# Patient Record
Sex: Female | Born: 1985 | Race: White | Hispanic: No | Marital: Married | Smoking: Former smoker
Health system: Southern US, Community
[De-identification: ages and names within clinical notes are randomized; demographics above are authoritative.]

## PROBLEM LIST (undated history)

## (undated) DIAGNOSIS — F419 Anxiety disorder, unspecified: Secondary | ICD-10-CM

## (undated) DIAGNOSIS — I7300 Raynaud's syndrome without gangrene: Secondary | ICD-10-CM

## (undated) DIAGNOSIS — J45909 Unspecified asthma, uncomplicated: Secondary | ICD-10-CM

## (undated) DIAGNOSIS — IMO0002 Reserved for concepts with insufficient information to code with codable children: Secondary | ICD-10-CM

## (undated) DIAGNOSIS — R87619 Unspecified abnormal cytological findings in specimens from cervix uteri: Secondary | ICD-10-CM

## (undated) DIAGNOSIS — R51 Headache: Secondary | ICD-10-CM

## (undated) DIAGNOSIS — B999 Unspecified infectious disease: Secondary | ICD-10-CM

## (undated) HISTORY — PX: DILATION AND CURETTAGE OF UTERUS: SHX78

---
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
## 2010-06-04 ENCOUNTER — Emergency Department (HOSPITAL_COMMUNITY)
Admission: EM | Admit: 2010-06-04 | Discharge: 2010-06-05 | Disposition: A | Discharge status: Home / Self Care | Payer: BC Managed Care – PPO | Attending: Emergency Medicine | Admitting: Emergency Medicine

## 2010-06-04 DIAGNOSIS — L272 Dermatitis due to ingested food: Secondary | ICD-10-CM | POA: Insufficient documentation

## 2010-06-04 DIAGNOSIS — IMO0002 Reserved for concepts with insufficient information to code with codable children: Secondary | ICD-10-CM | POA: Insufficient documentation

## 2010-06-04 DIAGNOSIS — W64XXXA Exposure to other animate mechanical forces, initial encounter: Secondary | ICD-10-CM | POA: Insufficient documentation

## 2010-06-04 DIAGNOSIS — R21 Rash and other nonspecific skin eruption: Secondary | ICD-10-CM | POA: Insufficient documentation

## 2010-06-04 DIAGNOSIS — M25579 Pain in unspecified ankle and joints of unspecified foot: Secondary | ICD-10-CM | POA: Insufficient documentation

## 2011-04-25 ENCOUNTER — Ambulatory Visit
Admission: RE | Admit: 2011-04-25 | Discharge: 2011-04-25 | Disposition: A | Discharge status: Home / Self Care | Payer: BC Managed Care – PPO | Source: Ambulatory Visit | Attending: Physician Assistant | Admitting: Physician Assistant

## 2011-04-25 ENCOUNTER — Other Ambulatory Visit: Payer: Self-pay | Admitting: Physician Assistant

## 2011-04-25 ENCOUNTER — Encounter (HOSPITAL_COMMUNITY): Payer: Self-pay | Admitting: General Practice

## 2011-04-25 ENCOUNTER — Inpatient Hospital Stay (HOSPITAL_COMMUNITY)
Admission: AD | Admit: 2011-04-25 | Discharge: 2011-04-27 | DRG: 368 | Disposition: A | Discharge status: Home / Self Care | Payer: BC Managed Care – PPO | Source: Ambulatory Visit | Attending: Obstetrics and Gynecology | Admitting: Obstetrics and Gynecology

## 2011-04-25 DIAGNOSIS — N731 Chronic parametritis and pelvic cellulitis: Principal | ICD-10-CM | POA: Diagnosis present

## 2011-04-25 DIAGNOSIS — N949 Unspecified condition associated with female genital organs and menstrual cycle: Secondary | ICD-10-CM | POA: Diagnosis present

## 2011-04-25 HISTORY — DX: Reserved for concepts with insufficient information to code with codable children: IMO0002

## 2011-04-25 HISTORY — DX: Unspecified abnormal cytological findings in specimens from cervix uteri: R87.619

## 2011-04-25 HISTORY — DX: Headache: R51

## 2011-04-25 HISTORY — DX: Anxiety disorder, unspecified: F41.9

## 2011-04-25 HISTORY — DX: Unspecified infectious disease: B99.9

## 2011-04-25 MED ORDER — SODIUM CHLORIDE 0.9 % IV SOLN
2.0000 g | Freq: Four times a day (QID) | INTRAVENOUS | Status: DC
  Administered 2011-04-25 – 2011-04-27 (×6): 2 g via INTRAVENOUS
  Filled 2011-04-25 (×9): qty 2000

## 2011-04-25 MED ORDER — SODIUM CHLORIDE 0.9 % IJ SOLN
3.0000 mL | Freq: Two times a day (BID) | INTRAMUSCULAR | Status: DC
  Administered 2011-04-25: 3 mL via INTRAVENOUS

## 2011-04-25 MED ORDER — IOHEXOL 300 MG/ML  SOLN
100.0000 mL | Freq: Once | INTRAMUSCULAR | Status: AC | PRN
  Administered 2011-04-25: 100 mL via INTRAVENOUS

## 2011-04-25 MED ORDER — SODIUM CHLORIDE 0.9 % IJ SOLN
3.0000 mL | INTRAMUSCULAR | Status: DC | PRN
  Administered 2011-04-25 – 2011-04-26 (×2): 3 mL via INTRAVENOUS

## 2011-04-25 MED ORDER — GENTAMICIN SULFATE 40 MG/ML IJ SOLN
Freq: Once | INTRAVENOUS | Status: AC
  Administered 2011-04-25: 22:00:00 via INTRAVENOUS
  Filled 2011-04-25: qty 3

## 2011-04-25 MED ORDER — GENTAMICIN SULFATE 40 MG/ML IJ SOLN
Freq: Three times a day (TID) | INTRAVENOUS | Status: DC
  Administered 2011-04-26 – 2011-04-27 (×4): via INTRAVENOUS
  Filled 2011-04-25 (×6): qty 2.75

## 2011-04-25 MED ORDER — CLINDAMYCIN PHOSPHATE 900 MG/50ML IV SOLN
900.0000 mg | Freq: Three times a day (TID) | INTRAVENOUS | Status: DC
  Filled 2011-04-25: qty 50

## 2011-04-25 NOTE — Progress Notes (Signed)
ANTIBIOTIC CONSULT NOTE - INITIAL  Pharmacy Consult for gentamicin Indication: pelvic abcess  No Known Allergies  Patient Measurements: Height: 5\' 6"  (167.6 cm) Weight: 117 lb (53.071 kg) IBW/kg (Calculated) : 59.3    Vital Signs: Temp: 98.7 F (37.1 C) (03/25 2003) Temp src: Oral (03/25 2003) BP: 123/79 mmHg (03/25 2003) Pulse Rate: 100  (03/25 2003) Intake/Output from previous day:   Intake/Output from this shift:    Labs: No results found for this basename: WBC:3,HGB:3,PLT:3,LABCREA:3,CREATININE:3 in the last 72 hours CrCl is unknown because no creatinine reading has been taken. No results found for this basename: VANCOTROUGH:2,VANCOPEAK:2,VANCORANDOM:2,GENTTROUGH:2,GENTPEAK:2,GENTRANDOM:2,TOBRATROUGH:2,TOBRAPEAK:2,TOBRARND:2,AMIKACINPEAK:2,AMIKACINTROU:2,AMIKACIN:2, in the last 72 hours   Microbiology: No results found for this or any previous visit (from the past 720 hour(s)).  Medical History: Past Medical History  Diagnosis Date  . Anxiety   . Abnormal Pap smear   . Infection     gonorrhea   . Headache     Medications:  Scheduled:    . ampicillin (OMNIPEN) IV  2 g Intravenous Q6H  . gentamicin (GARAMYCIN) with clindamycin (CLEOCIN) IV   Intravenous Once   Followed by  . gentamicin (GARAMYCIN) with clindamycin (CLEOCIN) IV   Intravenous Q8H  . sodium chloride  3 mL Intravenous Q12H  . DISCONTD: clindamycin (CLEOCIN) IV  900 mg Intravenous Q8H   Assessment: 26 yo female with pelvic abscess. Pain on right and left side reporting pain of 8/10.  UTI treated 3 weeks ago.  Will start Ampicillin/ Gentamicin/Clindamycin.   Goal of Therapy:  Gentamicin peak 6-8, trough <1.    Plan:  Gentamicin 120mg  loading dose followed by 110mg  IV Q 8 hours. Will check SCR in the AM.   Mackenzie Anderson Mackenzie Anderson 04/25/2011,8:49 PM

## 2011-04-25 NOTE — Progress Notes (Signed)
Subjective: Patient reports tolerating PO and no problems voiding.   Pain improved. Vaginal drainage continues  Objective:  BP 118/74  Pulse 91  Temp(Src) 98.1 F (36.7 C) (Oral)  Resp 16  Ht 5\' 6"  (1.676 m)  Wt 53.071 kg (117 lb)  BMI 18.88 kg/m2  SpO2 100%  LMP 04/25/2011  I have reviewed patient's vital signs, medications, labs and radiology results.  General: alert, cooperative and appears stated age Resp: clear to auscultation bilaterally and normal percussion bilaterally Cardio: regular rate and rhythm, S1, S2 normal, no murmur, click, rub or gallop GI: soft, non-tender; bowel sounds normal; no masses,  no organomegaly Extremities: extremities normal, atraumatic, no cyanosis or edema Vaginal Bleeding: none and pustular dc noted   Assessment/Plan: Pelvic Abcess of ? Etx.  Spontaneous drainage/rupture into vagina. Will continue IV ABX and monitor clinical course.   LOS: 0 days    Athene Schuhmacher J 04/25/2011, 9:37 PM

## 2011-04-26 LAB — CREATININE, SERUM
Creatinine, Ser: 0.72 mg/dL (ref 0.50–1.10)
GFR calc Af Amer: >90 mL/min (ref >90)
GFR calc non Af Amer: >90 mL/min (ref >90)

## 2011-04-26 MED ORDER — IBUPROFEN 600 MG PO TABS: 600.0000 mg | ORAL_TABLET | Freq: Four times a day (QID) | ORAL | Status: DC | PRN

## 2011-04-26 MED ORDER — IBUPROFEN 600 MG PO TABS
600.0000 mg | ORAL_TABLET | Freq: Four times a day (QID) | ORAL | Status: DC | PRN
  Administered 2011-04-26 (×3): 600 mg via ORAL
  Filled 2011-04-26 (×3): qty 1

## 2011-04-26 MED ORDER — ESCITALOPRAM OXALATE 10 MG PO TABS
10.0000 mg | ORAL_TABLET | Freq: Every day | ORAL | Status: DC
  Administered 2011-04-26: 10 mg via ORAL
  Filled 2011-04-26 (×2): qty 1

## 2011-04-26 NOTE — Progress Notes (Signed)
Serum Creatinine drawn this am = 0.72; Gentamicin dose was calculated using an estimated SCr of 0.7.  Will continue gentamicin at same dose and monitor levels when necessary.

## 2011-04-26 NOTE — Progress Notes (Signed)
UR Chart review completed.  

## 2011-04-26 NOTE — Progress Notes (Signed)
Subjective: Patient reports tolerating PO, + flatus and no problems voiding.   Feels well. Minimal vaginal dc. No bleeding. Minimal pain.  Objective: BP 99/69  Pulse 85  Temp(Src) 98 F (36.7 C) (Oral)  Resp 18  Ht 5\' 6"  (1.676 m)  Wt 53.071 kg (117 lb)  BMI 18.88 kg/m2  SpO2 100%  LMP 04/25/2011  I have reviewed patient's vital signs, intake and output, labs and radiology results.  General: alert, cooperative, appears stated age, no distress and pale Resp: clear to auscultation bilaterally and normal percussion bilaterally Cardio: regular rate and rhythm, S1, S2 normal, no murmur, click, rub or gallop and normal apical impulse GI: soft, non-tender; bowel sounds normal; no masses,  no organomegaly, normal findings: bowel sounds normal, abnormal findings:  slight tendernsess to deep palpation and no rebound Extremities: extremities normal, atraumatic, no cyanosis or edema Vaginal Bleeding: none and small amount of pustular dc    Assessment/Plan: Pelvic Abcess spontaneously draining per vagina of ? etx Clinically improved on Triple ABx. Afebrile. Will continue IV ABX. Reck CBC in am.  LOS: 1 day    Mackenzie Anderson J 04/26/2011, 4:39 PM

## 2011-04-26 NOTE — H&P (Signed)
NAMEMAURICA, OMURA            ACCOUNT NO.:  [id number]  MEDICAL RECORD NO.:  [id number]  LOCATION:  9306                          FACILITY:  [facility]  PHYSICIAN:  Lenoard Aden, M.D.DATE OF BIRTH:  1985/08/12  DATE OF ADMISSION:  04/25/2011 DATE OF DISCHARGE:                             HISTORY & PHYSICAL   CHIEF COMPLAINT:  Pelvic pain.  HISTORY OF PRESENT ILLNESS:  This is a 26 year old white female, G0, P0, with remote history of PID, who presents now with 2-week history of pelvic pain, seen by her PCP, treated for constipation, now with CT exam today consistent with pelvic abscess.  She was noted to have spontaneous vaginal drainage, pustular discharge this afternoon, which was noted to be a spontaneous rupture of this abscess into the apex of the vagina.  PAST MEDICAL HISTORY:  Unremarkable.  Medications are birth control pills, Vyvanse, clonazepam, Lexapro, cephalexin p.r.n.  She has no known drug allergies.  FAMILY HISTORY:  Noncontributory.  PHYSICAL EXAMINATION:  VITAL SIGNS:  Stable.  Patient is afebrile.  HEENT:  Normal.  NECK:  Supple.  Full range of motion.  LUNGS:  Clear.  HEART:  Regular rhythm.  ABDOMEN:  Soft, scaphoid, mild tenderness to palpation.  No rebound, no guarding.  PELVIC:  Concerns of firm fluctuance in the cul-de-sac with drainage in the posterior fornix, a pustular material consistent with abscess, this is cultured.  IMPRESSION:  Pelvic abscess with questionable etiology with leukocytosis, questionable TOA  versus perirectal abscess.  CAT scan as noted.  Case was discussed with Radiology and General Surgery.  PLAN:  Will be to admit for IV antibiotic therapy.  Follow clinical course closely.  Follow up CT with possible drainage, pending clinical course.     Lenoard Aden, M.D.     RJT/MEDQ  D:  04/25/2011  T:  04/25/2011  Job:  [id number]

## 2011-04-27 LAB — CBC
HCT: 32.3 % — ABNORMAL LOW (ref 36.0–46.0)
MCHC: 31.9 g/dL (ref 30.0–36.0)
MCV: 85.4 fL (ref 78.0–100.0)
Platelets: 300 10*3/uL (ref 150–400)
RBC: 3.78 MIL/uL — ABNORMAL LOW (ref 3.87–5.11)
RDW: 12.3 % (ref 11.5–15.5)
WBC: 3.9 10*3/uL — ABNORMAL LOW (ref 4.0–10.5)

## 2011-04-27 MED ORDER — IBUPROFEN 600 MG PO TABS: 600.0000 mg | ORAL_TABLET | Freq: Four times a day (QID) | ORAL | Status: AC | PRN

## 2011-04-27 MED ORDER — LEVOFLOXACIN 500 MG PO TABS: 500.0000 mg | ORAL_TABLET | Freq: Every day | ORAL | Status: AC

## 2011-04-27 MED ORDER — METRONIDAZOLE 500 MG PO TABS: 500.0000 mg | ORAL_TABLET | Freq: Two times a day (BID) | ORAL | Status: AC

## 2011-04-27 NOTE — Progress Notes (Signed)
Subjective: Patient reports tolerating PO and no problems voiding.   Feels better. No vaginal dc. Mild discomfort with bowel movements.  Objective: I have reviewed patient's vital signs, intake and output, medications, labs and radiology results.  General: alert and no distress Resp: clear to auscultation bilaterally and normal percussion bilaterally Cardio: regular rate and rhythm, S1, S2 normal, no murmur, click, rub or gallop and normal apical impulse GI: soft, non-tender; bowel sounds normal; no masses,  no organomegaly and normal findings: aorta normal, bowel sounds normal, no bruits heard, soft, non-tender and umbilicus normal Extremities: extremities normal, atraumatic, no cyanosis or edema Vaginal Bleeding: none No pustular drainage   Assessment/Plan: Pelvic Abcess of ? etx- clinically improved DC home  Levofloxacin 500mg  qd x 12 Flagyl 500mg  bid x 12 Fu CT next week  LOS: 2 days    Mackenzie Anderson J 04/27/2011, 7:25 AM

## 2011-04-27 NOTE — Progress Notes (Signed)
PT.  WAS DISCHARGED IN THE CARE OF FRIEND. DOWNSTAIR PER AMBULATORY. DENIES ANY PAIN  OR DISCOMFORT. DENIES HEAVY VAGINAL BLEEDING.(MENES IN PROGRESS) STABLE.

## 2011-05-02 ENCOUNTER — Other Ambulatory Visit (HOSPITAL_COMMUNITY): Payer: Self-pay | Admitting: Obstetrics and Gynecology

## 2011-05-02 DIAGNOSIS — L0291 Cutaneous abscess, unspecified: Secondary | ICD-10-CM

## 2011-05-02 NOTE — Discharge Summary (Signed)
NAMEJUNI, GLAAB            ACCOUNT NO.:  [id number]  MEDICAL RECORD NO.:  [id number]  LOCATION:  9306                          FACILITY:  [facility]  PHYSICIAN:  Lenoard Aden, M.D.DATE OF BIRTH:  12-Jun-1985  DATE OF ADMISSION:  04/25/2011 DATE OF DISCHARGE:  04/27/2011                              DISCHARGE SUMMARY   ADMISSION DIAGNOSIS:  Pelvic abscess.  DISCHARGE DIAGNOSIS:  Pelvic abscess.  HOSPITAL COURSE:  The patient underwent admission for IV antibiotics due to spontaneous discovery of a draining pelvic abscess in the cul-de-sac. She was admitted previously with an office white blood cell count of 12. She was placed on __________, gentamicin, and clindamycin IV.  Her pain dramatically improved.  She remained febrile.  A repeat white blood cell count was 4.  She was discharged to home on hospital day #2.  DISCHARGE MEDICATIONS:  Levaquin and Flagyl.  She is to follow up in my office in 1 week.  Discharge teaching done . She has a repeat CT scan scheduled for 1 week.     Lenoard Aden, M.D.     RJT/MEDQ  D:  05/01/2011  T:  05/01/2011  Job:  [id number]

## 2011-05-03 ENCOUNTER — Encounter (HOSPITAL_COMMUNITY): Payer: Self-pay

## 2011-05-03 ENCOUNTER — Other Ambulatory Visit (HOSPITAL_COMMUNITY): Payer: Self-pay | Admitting: Obstetrics and Gynecology

## 2011-05-03 ENCOUNTER — Other Ambulatory Visit: Payer: Self-pay | Admitting: Radiology

## 2011-05-03 ENCOUNTER — Ambulatory Visit (HOSPITAL_COMMUNITY)
Admission: RE | Admit: 2011-05-03 | Discharge: 2011-05-03 | Disposition: A | Discharge status: Home / Self Care | Payer: BC Managed Care – PPO | Source: Ambulatory Visit | Attending: Obstetrics and Gynecology | Admitting: Obstetrics and Gynecology

## 2011-05-03 VITALS — BP 103/68 | HR 70 | Temp 97.0°F | Resp 16 | Ht 66.0 in | Wt 117.0 lb

## 2011-05-03 DIAGNOSIS — N731 Chronic parametritis and pelvic cellulitis: Secondary | ICD-10-CM | POA: Insufficient documentation

## 2011-05-03 DIAGNOSIS — L0291 Cutaneous abscess, unspecified: Secondary | ICD-10-CM

## 2011-05-03 DIAGNOSIS — N739 Female pelvic inflammatory disease, unspecified: Secondary | ICD-10-CM

## 2011-05-03 LAB — BASIC METABOLIC PANEL
CO2: 27 mEq/L (ref 19–32)
Calcium: 9.3 mg/dL (ref 8.4–10.5)
Chloride: 97 mEq/L (ref 96–112)
Glucose, Bld: 101 mg/dL — ABNORMAL HIGH (ref 70–99)
Sodium: 135 mEq/L (ref 135–145)

## 2011-05-03 LAB — CBC
HCT: 38.9 % (ref 36.0–46.0)
MCH: 27.9 pg (ref 26.0–34.0)
MCV: 82.8 fL (ref 78.0–100.0)
Platelets: 334 10*3/uL (ref 150–400)
RBC: 4.7 MIL/uL (ref 3.87–5.11)

## 2011-05-03 LAB — APTT: aPTT: 31 seconds (ref 24–37)

## 2011-05-03 MED ORDER — SODIUM CHLORIDE 0.9 % IV SOLN: Freq: Once | INTRAVENOUS | Status: DC

## 2011-05-03 MED ORDER — IOHEXOL 300 MG/ML  SOLN
80.0000 mL | Freq: Once | INTRAMUSCULAR | Status: AC | PRN
  Administered 2011-05-03: 80 mL via INTRAVENOUS

## 2011-05-03 NOTE — H&P (Signed)
Mackenzie Anderson is an 26 y.o. female.   Chief Complaint: pelvic inflammatory disease; pelvic abscess on 3/25 CT Spontaneous rupture of abscess through vagina Now scheduled for repeat CT to evaluate collection Also scheduled for pelvic abscess drain placement if necessary  HPI: pt feels so much better now; abd pain resolved  Past Medical History  Diagnosis Date  . Anxiety   . Abnormal Pap smear   . Infection     gonorrhea   . Headache     Past Surgical History  Procedure Date  . Wisdom tooth extraction 2008    No family history on file. Social History:  reports that she has never smoked. She does not have any smokeless tobacco history on file. She reports that she drinks alcohol. She reports that she does not use illicit drugs.  Allergies: No Known Allergies  Medications Prior to Admission  Medication Sig Dispense Refill  . escitalopram (LEXAPRO) 5 MG tablet Take 5 mg by mouth at bedtime.      Marland Kitchen ibuprofen (ADVIL,MOTRIN) 600 MG tablet Take 1 tablet (600 mg total) by mouth every 6 (six) hours as needed for pain.  30 tablet  0  . levofloxacin (LEVAQUIN) 500 MG tablet Take 1 tablet (500 mg total) by mouth daily.  12 tablet  0  . lisdexamfetamine (VYVANSE) 40 MG capsule Take 40 mg by mouth every morning.      . metroNIDAZOLE (FLAGYL) 500 MG tablet Take 1 tablet (500 mg total) by mouth 2 (two) times daily.  24 tablet  0  . Norgestimate-Ethinyl Estradiol Triphasic (ORTHO TRI-CYCLEN LO) 0.18/0.215/0.25 MG-25 MCG tablet Take 1 tablet by mouth daily.       Medications Prior to Admission  Medication Dose Route Frequency Provider Last Rate Last Dose  . 0.9 %  sodium chloride infusion   Intravenous Once Robet Leu, PA        No results found for this or any previous visit (from the past 48 hour(s)). No results found.  Review of Systems  Constitutional: Negative for fever.  Respiratory: Negative for cough.   Gastrointestinal: Negative for nausea, vomiting and abdominal pain.    Neurological: Negative for headaches.    Last menstrual period 04/25/2011. Physical Exam  Constitutional: She is oriented to person, place, and time. She appears well-developed and well-nourished.  Eyes: EOM are normal.  Cardiovascular: Normal rate, regular rhythm and normal heart sounds.   No murmur heard. Respiratory: Effort normal and breath sounds normal. She has no wheezes.  GI: Soft. Bowel sounds are normal. There is no tenderness.  Musculoskeletal: Normal range of motion.  Neurological: She is alert and oriented to person, place, and time.     Assessment/Plan Pelvic abscess that has spontaneously drained through vagina Scheduled for reCT to evaluate collection Abscess drain placement if necessary Pt aware of procedure benefits and risks and agreeable to proceed. Consent signed.  Algie Westry A 05/03/2011, 12:56 PM

## 2011-05-03 NOTE — Discharge Instructions (Signed)
Biopsy A biopsy is a procedure in which small samples of tissue are removed from the body. The tissue is examined under a microscope. A biopsy may be done to determine the cause (diagnosis) of a condition or mass (tumor). A biopsy may also be done to determine the best treatment for you. In some instances, a biopsy may be performed on normal tissue to determine if cancer has spread or if a transplanted organ is being rejected. There are 2 ways to obtain samples:  Fine needle biopsy. Samples are removed using a thin needle inserted through the skin.   Open biopsy. Samples are removed after a cut (incision) is made through the skin.  LET YOUR CAREGIVER KNOW ABOUT:  Allergies to food or medicine.   Medicines taken, including vitamins, herbs, eyedrops, over-the-counter medicines, and creams.   Use of steroids (by mouth or creams).   Previous problems with anesthetics or numbing medicines.   History of bleeding problems or blood clots.   Previous surgery.   Other health problems, including diabetes and kidney problems.   Possibility of pregnancy, if this applies.  RISKS AND COMPLICATIONS  Bleeding from the biopsy site. The risk of bleeding is higher if you have a bleeding disorder or are taking any blood thinning medicines (anticoagulants).   Infection.   Injury to organs or structures near the biopsy site.   Chronic pain at the biopsy site. This is defined as pain that lasts for more than 3 months.   Very rarely, a second biopsy may be required if not enough tissue was collected during the first biopsy.  BEFORE THE PROCEDURE Ask your caregiver what time you need to arrive for your procedure. Ask your caregiver whether you need to stop eating or drinking (fast) before your procedure. Ask your caregiver about changing or stopping your regular medicines. A blood sample may be done to determine your blood clotting time. Medicine may be given to help you relax (sedative). PROCEDURE During  a fine needle biopsy, you will be awake during the procedure. You will be positioned to allow the best possible access to the biopsy site. Let your caregiver know if the position is not comfortable. The biopsy site will be cleaned. A needle is inserted through your skin. You may feel mild discomfort during this procedure. The needle is withdrawn once tissue samples have been removed. Pressure may be applied to the biopsy site to reduce swelling and to ensure that bleeding has stopped. The samples will be sent to be examined. During an open biopsy, you may be given medicine that numbs the area (local anesthetic) or medicine that makes you sleep (general anesthetic). An incision is made through the skin. A tissue sample or the entire mass is removed. The sample or mass will be sent to be examined. Sometimes, the sample or mass may be examined during the procedure. If the sample or mass contains cancer cells, further tissue or structures may be removed. The incision is then closed with stitches (sutures) or skin glue (adhesive). AFTER THE PROCEDURE Your recovery will be assessed and monitored. If there are no problems, you should be able to go home shortly after the procedure (outpatient). You will need to arrange for someone to drive you home if you received a sedative or pain relieving medicine during the procedure. Ask when your test results will be ready. Make sure you get your test results. Document Released: 01/15/2000 Document Revised: 01/06/2011 Document Reviewed: 07/15/2010 [employer] Patient Information 2012 [employer]. 

## 2013-06-06 IMAGING — CT CT PELVIS W/ CM
3 series · 14 of 32 positions shown, 19 images · IV contrast (READICAT & 80ml omni 300)
Comparison: 04/25/2011

CLINICAL DATA: Pelvic abscess, spontaneous drainage vaginally.
Assess for residual abscess and possible drainage or aspiration.

CT PELVIS WITH CONTRAST
TECHNIQUE: Multidetector CT imaging of the pelvis was performed
using the standard protocol following the bolus administration of
intravenous contrast.
Contrast:   80 ml 2mnipaque-HSS

[Series 2: routine abdomen · axial · 0.74mm/px · z∈[-300,-135]mm · 4 of 55 slices shown, 9 images]
[im 11/55  soft-tissue]
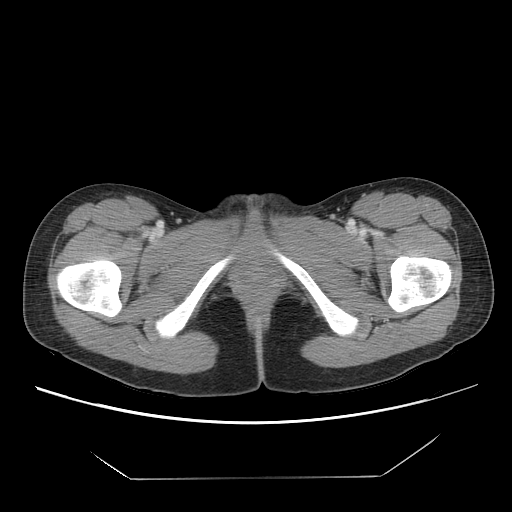
[im 11/55  lung]
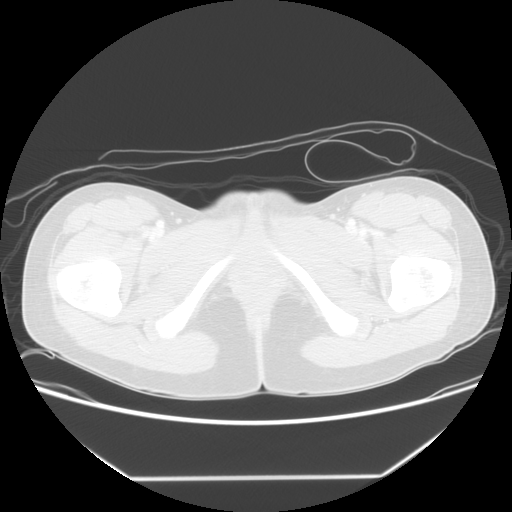
[im 11/55  bone]
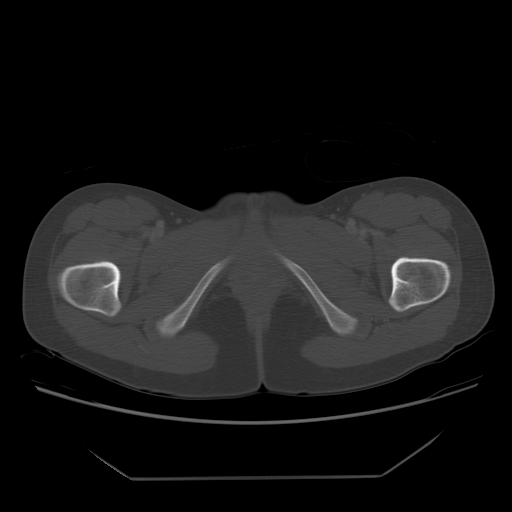
[im 22/55  soft-tissue]
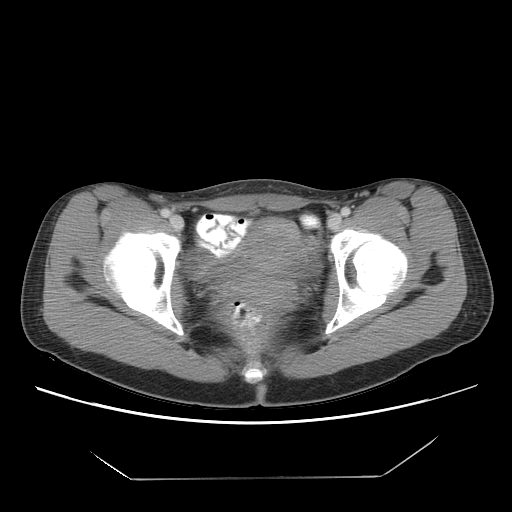
[im 22/55  lung]
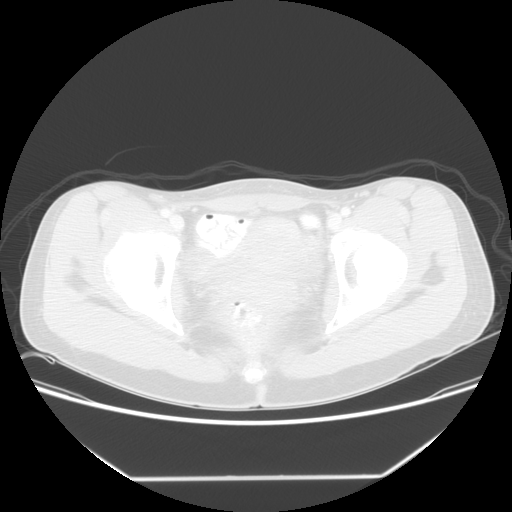
[im 33/55  soft-tissue]
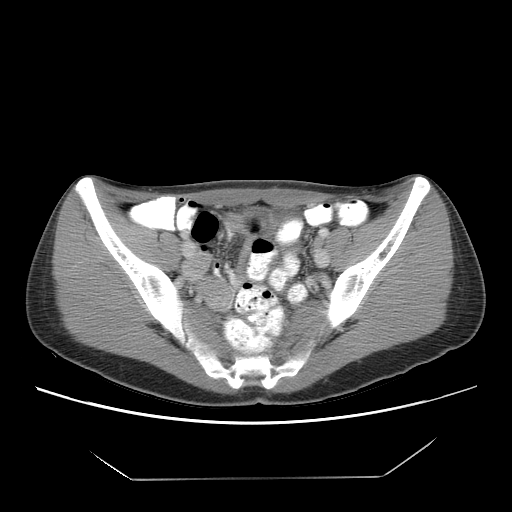
[im 33/55  lung]
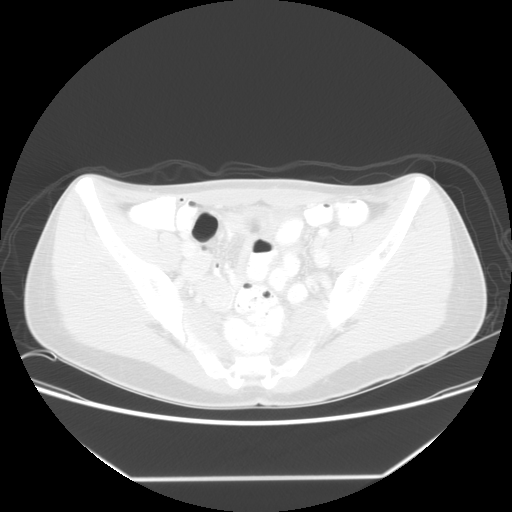
[im 44/55  soft-tissue]
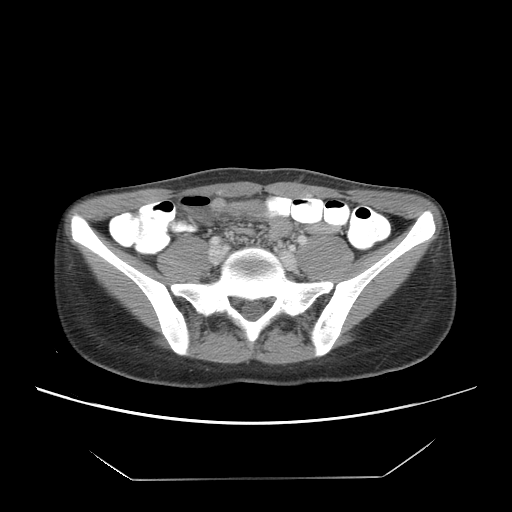
[im 44/55  lung]
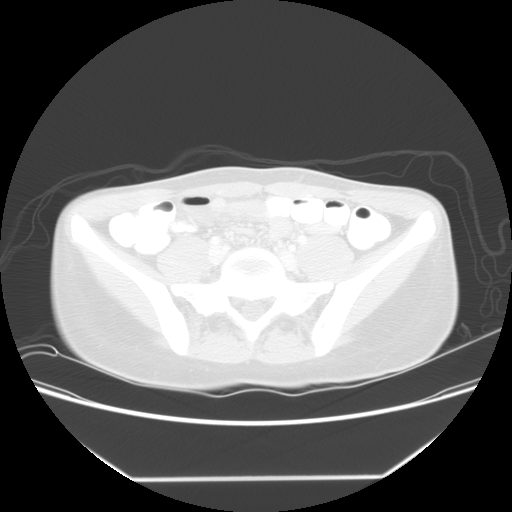

[Series 400: cor · coronal · 0.74mm/px · 8 of 114 slices shown]
[im 10/114  soft-tissue]
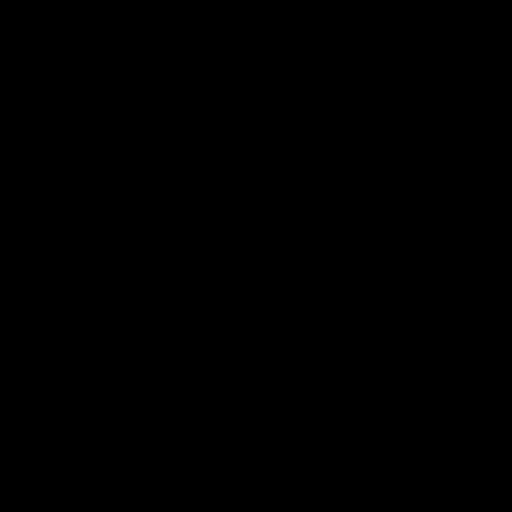
[im 29/114  soft-tissue]
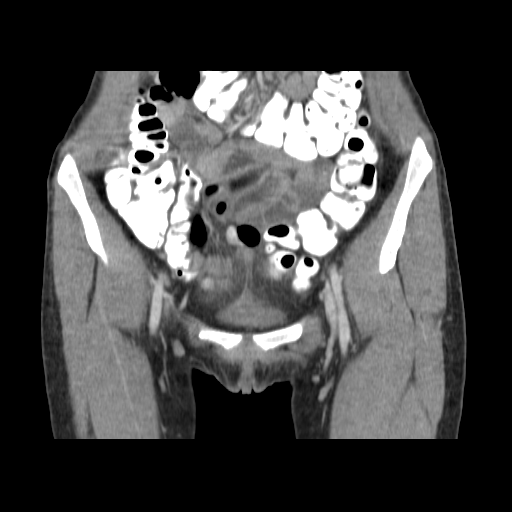
[im 38/114  soft-tissue]
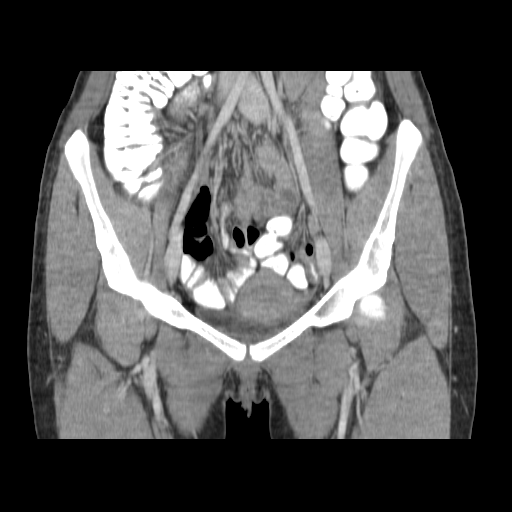
[im 48/114  soft-tissue]
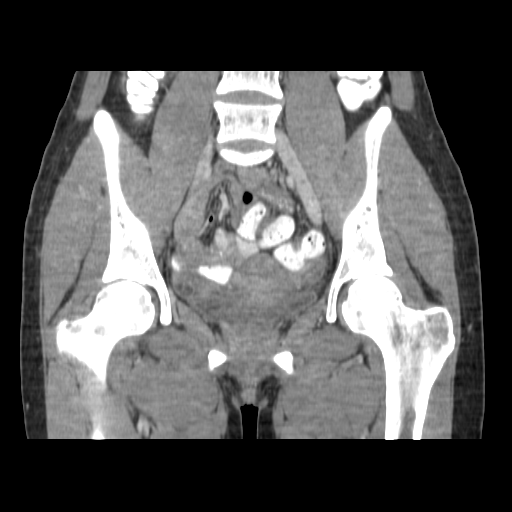
[im 66/114  soft-tissue]
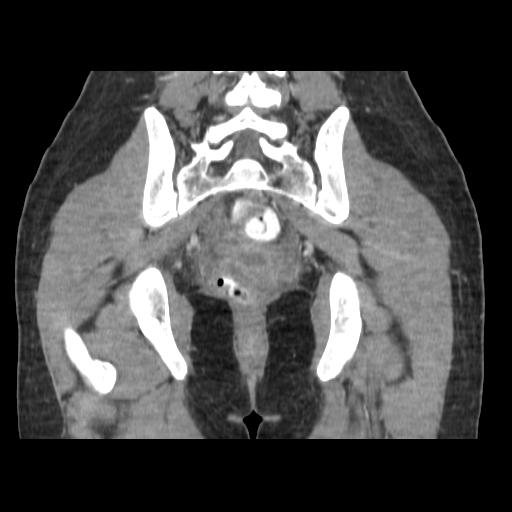
[im 76/114  soft-tissue]
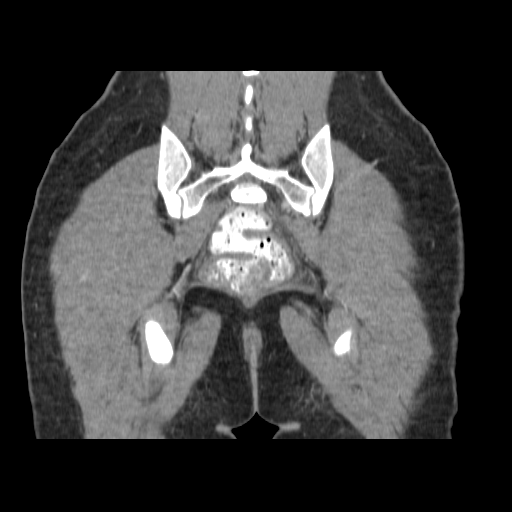
[im 85/114  soft-tissue]
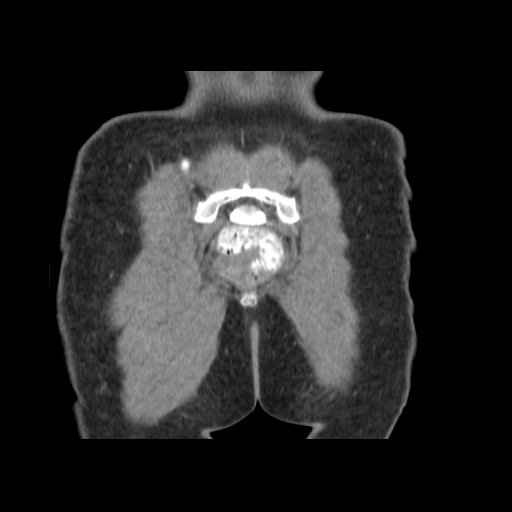
[im 104/114  soft-tissue]
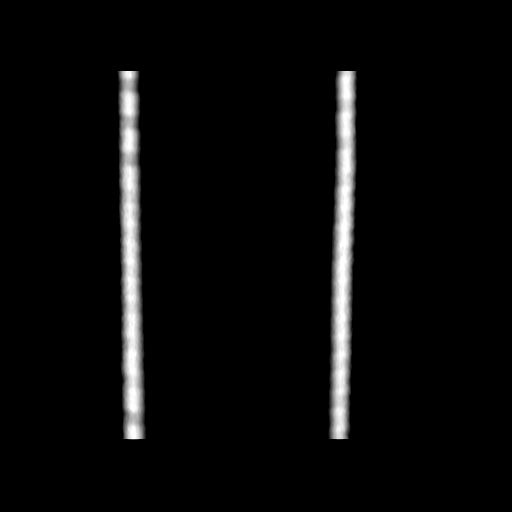

[Series 401: sag · sagittal · 0.74mm/px · 2 of 121 slices shown]
[im 11/121  soft-tissue]
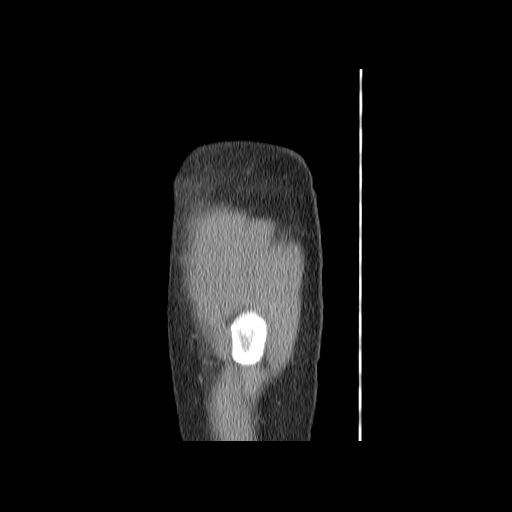
[im 31/121  soft-tissue]
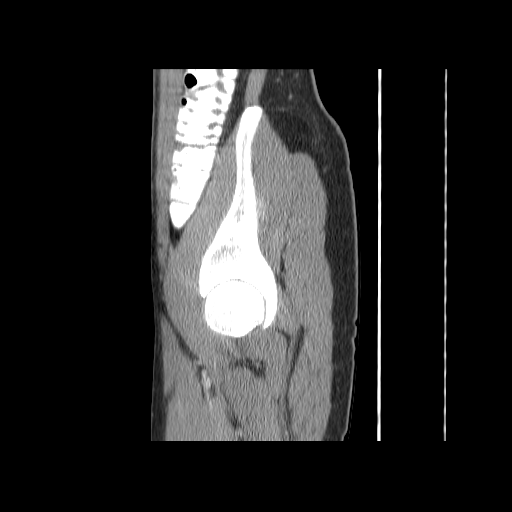

[14 of 32 positions shown; findings below may reference images not displayed]

FINDINGS: Compared to the prior study, the cul-de-sac enhancing
fluid collection has nearly resolved.  A small amount of fluid
remains with peripheral enhancement now measuring only 2.8 cm wide
and 11 mm in thickness.  No significant residual component that
warrants drain catheter placement or aspiration.

No other pelvic significant abnormality.  No acute distal bowel
process.  Negative for adenopathy.  Urinary bladder, uterus and
adnexa appear unremarkable by CT.  No inguinal abnormality.
IMPRESSION: Near complete resolution of the pelvic abscess.  No significant
residual component that warrants a drain catheter or aspiration.

## 2015-09-30 DIAGNOSIS — Z681 Body mass index (BMI) 19 or less, adult: Secondary | ICD-10-CM | POA: Diagnosis not present

## 2015-09-30 DIAGNOSIS — Z01419 Encounter for gynecological examination (general) (routine) without abnormal findings: Secondary | ICD-10-CM | POA: Diagnosis not present

## 2015-10-09 DIAGNOSIS — F900 Attention-deficit hyperactivity disorder, predominantly inattentive type: Secondary | ICD-10-CM | POA: Diagnosis not present

## 2015-10-09 DIAGNOSIS — F411 Generalized anxiety disorder: Secondary | ICD-10-CM | POA: Diagnosis not present

## 2015-10-09 DIAGNOSIS — R300 Dysuria: Secondary | ICD-10-CM | POA: Diagnosis not present

## 2015-10-14 DIAGNOSIS — R102 Pelvic and perineal pain: Secondary | ICD-10-CM | POA: Diagnosis not present

## 2016-01-01 DIAGNOSIS — L732 Hidradenitis suppurativa: Secondary | ICD-10-CM | POA: Diagnosis not present

## 2016-01-08 DIAGNOSIS — J329 Chronic sinusitis, unspecified: Secondary | ICD-10-CM | POA: Diagnosis not present

## 2016-01-22 DIAGNOSIS — N898 Other specified noninflammatory disorders of vagina: Secondary | ICD-10-CM | POA: Diagnosis not present

## 2016-01-22 DIAGNOSIS — J069 Acute upper respiratory infection, unspecified: Secondary | ICD-10-CM | POA: Diagnosis not present

## 2016-05-17 DIAGNOSIS — J029 Acute pharyngitis, unspecified: Secondary | ICD-10-CM | POA: Diagnosis not present

## 2016-05-17 DIAGNOSIS — F411 Generalized anxiety disorder: Secondary | ICD-10-CM | POA: Diagnosis not present

## 2016-05-17 DIAGNOSIS — J302 Other seasonal allergic rhinitis: Secondary | ICD-10-CM | POA: Diagnosis not present

## 2016-05-17 DIAGNOSIS — F900 Attention-deficit hyperactivity disorder, predominantly inattentive type: Secondary | ICD-10-CM | POA: Diagnosis not present

## 2016-10-17 DIAGNOSIS — J309 Allergic rhinitis, unspecified: Secondary | ICD-10-CM | POA: Diagnosis not present

## 2016-10-17 DIAGNOSIS — F900 Attention-deficit hyperactivity disorder, predominantly inattentive type: Secondary | ICD-10-CM | POA: Diagnosis not present

## 2016-10-17 DIAGNOSIS — F411 Generalized anxiety disorder: Secondary | ICD-10-CM | POA: Diagnosis not present

## 2017-03-10 DIAGNOSIS — Z681 Body mass index (BMI) 19 or less, adult: Secondary | ICD-10-CM | POA: Diagnosis not present

## 2017-03-10 DIAGNOSIS — Z01419 Encounter for gynecological examination (general) (routine) without abnormal findings: Secondary | ICD-10-CM | POA: Diagnosis not present

## 2017-03-17 DIAGNOSIS — L732 Hidradenitis suppurativa: Secondary | ICD-10-CM | POA: Diagnosis not present

## 2017-03-20 DIAGNOSIS — J209 Acute bronchitis, unspecified: Secondary | ICD-10-CM | POA: Diagnosis not present

## 2017-03-20 DIAGNOSIS — J029 Acute pharyngitis, unspecified: Secondary | ICD-10-CM | POA: Diagnosis not present

## 2017-03-29 DIAGNOSIS — F900 Attention-deficit hyperactivity disorder, predominantly inattentive type: Secondary | ICD-10-CM | POA: Diagnosis not present

## 2017-03-29 DIAGNOSIS — F411 Generalized anxiety disorder: Secondary | ICD-10-CM | POA: Diagnosis not present

## 2017-03-29 DIAGNOSIS — J309 Allergic rhinitis, unspecified: Secondary | ICD-10-CM | POA: Diagnosis not present

## 2017-03-29 DIAGNOSIS — J209 Acute bronchitis, unspecified: Secondary | ICD-10-CM | POA: Diagnosis not present

## 2017-05-01 DIAGNOSIS — J309 Allergic rhinitis, unspecified: Secondary | ICD-10-CM | POA: Diagnosis not present

## 2017-05-01 DIAGNOSIS — R05 Cough: Secondary | ICD-10-CM | POA: Diagnosis not present

## 2017-05-01 DIAGNOSIS — H1045 Other chronic allergic conjunctivitis: Secondary | ICD-10-CM | POA: Diagnosis not present

## 2017-07-04 DIAGNOSIS — Z8742 Personal history of other diseases of the female genital tract: Secondary | ICD-10-CM | POA: Diagnosis not present

## 2017-07-04 DIAGNOSIS — Z3201 Encounter for pregnancy test, result positive: Secondary | ICD-10-CM | POA: Diagnosis not present

## 2017-07-06 DIAGNOSIS — Z8742 Personal history of other diseases of the female genital tract: Secondary | ICD-10-CM | POA: Diagnosis not present

## 2017-07-10 DIAGNOSIS — Z8742 Personal history of other diseases of the female genital tract: Secondary | ICD-10-CM | POA: Diagnosis not present

## 2017-08-02 DIAGNOSIS — Z3201 Encounter for pregnancy test, result positive: Secondary | ICD-10-CM | POA: Diagnosis not present

## 2017-08-09 DIAGNOSIS — Z3401 Encounter for supervision of normal first pregnancy, first trimester: Secondary | ICD-10-CM | POA: Diagnosis not present

## 2017-08-09 DIAGNOSIS — Z3689 Encounter for other specified antenatal screening: Secondary | ICD-10-CM | POA: Diagnosis not present

## 2017-08-14 DIAGNOSIS — Z3689 Encounter for other specified antenatal screening: Secondary | ICD-10-CM | POA: Diagnosis not present

## 2017-08-14 DIAGNOSIS — O021 Missed abortion: Secondary | ICD-10-CM | POA: Diagnosis not present

## 2017-08-15 ENCOUNTER — Other Ambulatory Visit: Payer: Self-pay | Admitting: Obstetrics and Gynecology

## 2017-08-15 DIAGNOSIS — Z419 Encounter for procedure for purposes other than remedying health state, unspecified: Secondary | ICD-10-CM

## 2017-08-16 ENCOUNTER — Encounter (HOSPITAL_COMMUNITY): Payer: Self-pay | Admitting: Emergency Medicine

## 2017-08-16 ENCOUNTER — Other Ambulatory Visit (HOSPITAL_COMMUNITY): Payer: Self-pay | Admitting: Obstetrics and Gynecology

## 2017-08-17 ENCOUNTER — Ambulatory Visit (HOSPITAL_COMMUNITY)
Admission: RE | Admit: 2017-08-17 | Payer: BLUE CROSS/BLUE SHIELD | Source: Ambulatory Visit | Admitting: Obstetrics and Gynecology

## 2017-08-17 DIAGNOSIS — O021 Missed abortion: Secondary | ICD-10-CM | POA: Diagnosis not present

## 2017-08-17 SURGERY — DILATION AND EVACUATION, UTERUS
Anesthesia: Choice

## 2017-08-24 DIAGNOSIS — O021 Missed abortion: Secondary | ICD-10-CM | POA: Diagnosis not present

## 2017-09-05 DIAGNOSIS — O021 Missed abortion: Secondary | ICD-10-CM | POA: Diagnosis not present

## 2017-09-18 DIAGNOSIS — O021 Missed abortion: Secondary | ICD-10-CM | POA: Diagnosis not present

## 2017-09-26 DIAGNOSIS — J45909 Unspecified asthma, uncomplicated: Secondary | ICD-10-CM | POA: Diagnosis not present

## 2017-09-26 DIAGNOSIS — F900 Attention-deficit hyperactivity disorder, predominantly inattentive type: Secondary | ICD-10-CM | POA: Diagnosis not present

## 2017-09-26 DIAGNOSIS — F411 Generalized anxiety disorder: Secondary | ICD-10-CM | POA: Diagnosis not present

## 2017-09-28 DIAGNOSIS — O021 Missed abortion: Secondary | ICD-10-CM | POA: Diagnosis not present

## 2017-10-06 DIAGNOSIS — O021 Missed abortion: Secondary | ICD-10-CM | POA: Diagnosis not present

## 2017-10-13 DIAGNOSIS — O020 Blighted ovum and nonhydatidiform mole: Secondary | ICD-10-CM | POA: Diagnosis not present

## 2017-12-01 DIAGNOSIS — J029 Acute pharyngitis, unspecified: Secondary | ICD-10-CM | POA: Diagnosis not present

## 2018-01-08 DIAGNOSIS — H1045 Other chronic allergic conjunctivitis: Secondary | ICD-10-CM | POA: Diagnosis not present

## 2018-01-08 DIAGNOSIS — J3089 Other allergic rhinitis: Secondary | ICD-10-CM | POA: Diagnosis not present

## 2018-01-08 DIAGNOSIS — R05 Cough: Secondary | ICD-10-CM | POA: Diagnosis not present

## 2018-01-08 DIAGNOSIS — J301 Allergic rhinitis due to pollen: Secondary | ICD-10-CM | POA: Diagnosis not present

## 2018-02-21 DIAGNOSIS — R070 Pain in throat: Secondary | ICD-10-CM | POA: Diagnosis not present

## 2018-02-21 DIAGNOSIS — J343 Hypertrophy of nasal turbinates: Secondary | ICD-10-CM | POA: Diagnosis not present

## 2018-02-21 DIAGNOSIS — J342 Deviated nasal septum: Secondary | ICD-10-CM | POA: Diagnosis not present

## 2018-02-21 DIAGNOSIS — J310 Chronic rhinitis: Secondary | ICD-10-CM | POA: Diagnosis not present

## 2018-03-21 DIAGNOSIS — J310 Chronic rhinitis: Secondary | ICD-10-CM | POA: Diagnosis not present

## 2018-03-21 DIAGNOSIS — J343 Hypertrophy of nasal turbinates: Secondary | ICD-10-CM | POA: Diagnosis not present

## 2018-03-21 DIAGNOSIS — J342 Deviated nasal septum: Secondary | ICD-10-CM | POA: Diagnosis not present

## 2018-03-30 DIAGNOSIS — F900 Attention-deficit hyperactivity disorder, predominantly inattentive type: Secondary | ICD-10-CM | POA: Diagnosis not present

## 2018-03-30 DIAGNOSIS — J069 Acute upper respiratory infection, unspecified: Secondary | ICD-10-CM | POA: Diagnosis not present

## 2018-03-30 DIAGNOSIS — F411 Generalized anxiety disorder: Secondary | ICD-10-CM | POA: Diagnosis not present

## 2018-04-27 DIAGNOSIS — J343 Hypertrophy of nasal turbinates: Secondary | ICD-10-CM | POA: Diagnosis not present

## 2018-04-27 DIAGNOSIS — J310 Chronic rhinitis: Secondary | ICD-10-CM | POA: Diagnosis not present

## 2018-04-27 DIAGNOSIS — J342 Deviated nasal septum: Secondary | ICD-10-CM | POA: Diagnosis not present

## 2018-05-16 DIAGNOSIS — Z01419 Encounter for gynecological examination (general) (routine) without abnormal findings: Secondary | ICD-10-CM | POA: Diagnosis not present

## 2018-05-16 DIAGNOSIS — Z681 Body mass index (BMI) 19 or less, adult: Secondary | ICD-10-CM | POA: Diagnosis not present

## 2018-05-16 DIAGNOSIS — Z113 Encounter for screening for infections with a predominantly sexual mode of transmission: Secondary | ICD-10-CM | POA: Diagnosis not present

## 2018-05-16 DIAGNOSIS — Z1151 Encounter for screening for human papillomavirus (HPV): Secondary | ICD-10-CM | POA: Diagnosis not present

## 2018-05-16 DIAGNOSIS — Z124 Encounter for screening for malignant neoplasm of cervix: Secondary | ICD-10-CM | POA: Diagnosis not present

## 2018-07-27 ENCOUNTER — Other Ambulatory Visit: Payer: Self-pay | Admitting: Otolaryngology

## 2018-08-13 ENCOUNTER — Other Ambulatory Visit: Payer: Self-pay

## 2018-08-13 ENCOUNTER — Encounter (HOSPITAL_BASED_OUTPATIENT_CLINIC_OR_DEPARTMENT_OTHER): Payer: Self-pay | Admitting: *Deleted

## 2018-08-14 ENCOUNTER — Other Ambulatory Visit (HOSPITAL_COMMUNITY): Admission: RE | Admit: 2018-08-14 | Payer: BLUE CROSS/BLUE SHIELD | Source: Ambulatory Visit

## 2018-08-17 ENCOUNTER — Ambulatory Visit (HOSPITAL_BASED_OUTPATIENT_CLINIC_OR_DEPARTMENT_OTHER): Admit: 2018-08-17 | Payer: BLUE CROSS/BLUE SHIELD | Admitting: Otolaryngology

## 2018-08-17 HISTORY — DX: Raynaud's syndrome without gangrene: I73.00

## 2018-08-17 HISTORY — DX: Unspecified asthma, uncomplicated: J45.909

## 2018-08-17 SURGERY — SEPTOPLASTY, NOSE, WITH NASAL TURBINATE REDUCTION
Anesthesia: General | Laterality: Bilateral

## 2018-09-28 DIAGNOSIS — Z1159 Encounter for screening for other viral diseases: Secondary | ICD-10-CM | POA: Diagnosis not present

## 2018-10-04 DIAGNOSIS — J3489 Other specified disorders of nose and nasal sinuses: Secondary | ICD-10-CM | POA: Diagnosis not present

## 2018-10-04 DIAGNOSIS — J343 Hypertrophy of nasal turbinates: Secondary | ICD-10-CM | POA: Diagnosis not present

## 2018-10-04 DIAGNOSIS — J342 Deviated nasal septum: Secondary | ICD-10-CM | POA: Diagnosis not present

## 2018-10-10 DIAGNOSIS — F411 Generalized anxiety disorder: Secondary | ICD-10-CM | POA: Diagnosis not present

## 2018-10-10 DIAGNOSIS — F900 Attention-deficit hyperactivity disorder, predominantly inattentive type: Secondary | ICD-10-CM | POA: Diagnosis not present

## 2018-12-31 DIAGNOSIS — Z20828 Contact with and (suspected) exposure to other viral communicable diseases: Secondary | ICD-10-CM | POA: Diagnosis not present

## 2019-01-14 DIAGNOSIS — H1045 Other chronic allergic conjunctivitis: Secondary | ICD-10-CM | POA: Diagnosis not present

## 2019-01-14 DIAGNOSIS — J3089 Other allergic rhinitis: Secondary | ICD-10-CM | POA: Diagnosis not present

## 2019-01-14 DIAGNOSIS — R05 Cough: Secondary | ICD-10-CM | POA: Diagnosis not present

## 2019-01-14 DIAGNOSIS — J301 Allergic rhinitis due to pollen: Secondary | ICD-10-CM | POA: Diagnosis not present

## 2019-01-23 ENCOUNTER — Ambulatory Visit: Payer: BC Managed Care – PPO | Attending: Internal Medicine

## 2019-01-23 DIAGNOSIS — Z20828 Contact with and (suspected) exposure to other viral communicable diseases: Secondary | ICD-10-CM | POA: Diagnosis not present

## 2019-01-23 DIAGNOSIS — Z20822 Contact with and (suspected) exposure to covid-19: Secondary | ICD-10-CM

## 2019-01-24 LAB — NOVEL CORONAVIRUS, NAA: SARS-CoV-2, NAA: DETECTED — AB

## 2019-02-28 DIAGNOSIS — J310 Chronic rhinitis: Secondary | ICD-10-CM | POA: Diagnosis not present

## 2019-02-28 DIAGNOSIS — R430 Anosmia: Secondary | ICD-10-CM | POA: Diagnosis not present

## 2019-03-05 DIAGNOSIS — R519 Headache, unspecified: Secondary | ICD-10-CM | POA: Diagnosis not present

## 2019-04-11 ENCOUNTER — Other Ambulatory Visit: Payer: Self-pay

## 2019-04-11 ENCOUNTER — Encounter: Payer: Self-pay | Admitting: Neurology

## 2019-04-11 ENCOUNTER — Ambulatory Visit (INDEPENDENT_AMBULATORY_CARE_PROVIDER_SITE_OTHER): Payer: BC Managed Care – PPO | Admitting: Neurology

## 2019-04-11 VITALS — BP 107/73 | HR 86 | Resp 18 | Ht 66.0 in | Wt 122.0 lb

## 2019-04-11 DIAGNOSIS — G4440 Drug-induced headache, not elsewhere classified, not intractable: Secondary | ICD-10-CM

## 2019-04-11 DIAGNOSIS — G4452 New daily persistent headache (NDPH): Secondary | ICD-10-CM | POA: Diagnosis not present

## 2019-04-11 MED ORDER — RIZATRIPTAN BENZOATE 10 MG PO TABS: ORAL_TABLET | ORAL | 5 refills | Status: AC

## 2019-04-11 MED ORDER — ZONISAMIDE 25 MG PO CAPS: ORAL_CAPSULE | ORAL | 0 refills | Status: DC

## 2019-04-11 NOTE — Progress Notes (Signed)
NEUROLOGY CONSULTATION NOTE  Mackenzie Anderson MRN: [id number] DOB: September 30, 1985  Referring provider: Deatra James, MD Primary care provider: Deatra James, MD  Reason for consult:  Post-COVID headache  HISTORY OF PRESENT ILLNESS: Mackenzie Anderson. Mackenzie Anderson is a 34 year old female with asthma, ADD, depression, anxiety and Raynaud's who presents for worsening headache following Covid-19 infection.  History supplemented by referring provider note.  She was diagnosed with COVID-19 in late December after presenting with headache, loss of taste and smell and mild shortness of breath.  Symptoms lasted a couple of days.  She noted some nasal congestion and followed up with Dr. Suszanne Conners, ENT, who performed a septoplasty in September 2020 and was found to have no sinus infection.  However, she was prescribed a prednisone taper on 03/01/2019, which was ineffective.  Headaches were initially severe but now more mild pressure in the front and back of head, which has been pretty much persistent.  Upright makes it worse, laying down makes it better.  Maybe some associated photosensitivity and occasional nausea but no visual disturbance, phonophobia.  She has been treating daily with Advil and caffeine 120mg .    She denies history of headaches.    Caffeine:  She will drink an energy drink with 120mg  of caffeine or cup of coffee Diet:  32 oz water daily.  Does not skip meals. Exercise:  Routine Depression:  no; Anxiety:  A little Other pain:  Some mild posterior neck pain Sleep:  Varies.  Somewhat improved after sinus surgery and use of melatonin.  Wakes up a couple of times a night but able to fall back asleep. Family history of headache:  Mother (migraines)   PAST MEDICAL HISTORY: Past Medical History:  Diagnosis Date  . Abnormal Pap smear   . Anxiety   . Asthma   . Headache(784.0)   . Infection    gonorrhea   . Raynaud's disease     PAST SURGICAL HISTORY: Past Surgical History:  Procedure Laterality  Date  . DILATION AND CURETTAGE OF UTERUS    . WISDOM TOOTH EXTRACTION  2008    MEDICATIONS: Current Outpatient Medications on File Prior to Visit  Medication Sig Dispense Refill  . acetaminophen (TYLENOL) 325 MG tablet Take 650 mg by mouth every 6 (six) hours as needed.    . ALBUTEROL IN Inhale into the lungs.    amphetamine-dextroamphetamine (ADDERALL) 15 MG tablet Take 15 mg by mouth daily.    2009 BIOTIN PO Take by mouth.    . clonazePAM (KLONOPIN) 0.5 MG tablet Take 0.5 mg by mouth at bedtime.     No current facility-administered medications on file prior to visit.    ALLERGIES: Allergies  Allergen Reactions  . Hydrocodone Nausea Only and Other (See Comments)    Lethargy  . Other Other (See Comments)    Mango - Blisters around mouth    FAMILY HISTORY: History reviewed. No pertinent family history.  SOCIAL HISTORY: Social History   Socioeconomic History  . Marital status: Single    Spouse name: Not on file  . Number of children: Not on file  . Years of education: Not on file  . Highest education level: Not on file  Occupational History  . Not on file  Tobacco Use  . Smoking status: Former Marland Kitchen  . Smokeless tobacco: Never Used  . Tobacco comment: last Smoked early 20's  Substance and Sexual Activity  . Alcohol use: Yes    Comment: socially   . Drug use: No  .  Sexual activity: Yes    Birth control/protection: None  Other Topics Concern  . Not on file  Social History Narrative  . Not on file   Social Determinants of Health   Financial Resource Strain:   . Difficulty of Paying Living Expenses:   Food Insecurity:   . Worried About Charity fundraiser in the Last Year:   . Arboriculturist in the Last Year:   Transportation Needs:   . Film/video editor (Medical):   Marland Kitchen Lack of Transportation (Non-Medical):   Physical Activity:   . Days of Exercise per Week:   . Minutes of Exercise per Session:   Stress:   . Feeling of Stress :   Social  Connections:   . Frequency of Communication with Friends and Family:   . Frequency of Social Gatherings with Friends and Family:   . Attends Religious Services:   . Active Member of Clubs or Organizations:   . Attends Archivist Meetings:   Marland Kitchen Marital Status:   Intimate Partner Violence:   . Fear of Current or Ex-Partner:   . Emotionally Abused:   Marland Kitchen Physically Abused:   . Sexually Abused:      PHYSICAL EXAM: Blood pressure 107/73, pulse 86, resp. rate 18, height 5\' 6"  (1.676 m), weight 122 lb (55.3 kg), SpO2 99 %. General: No acute distress.  Patient appears well-groomed.   Head:  Normocephalic/atraumatic Eyes:  fundi examined but not visualized Neck: supple, no paraspinal tenderness, full range of motion Back: No paraspinal tenderness Heart: regular rate and rhythm Lungs: Clear to auscultation bilaterally. Vascular: No carotid bruits. Neurological Exam: Mental status: alert and oriented to person, place, and time, recent and remote memory intact, fund of knowledge intact, attention and concentration intact, speech fluent and not dysarthric, language intact. Cranial nerves: CN I: not tested CN II: pupils equal, round and reactive to light, visual fields intact CN III, IV, VI:  full range of motion, no nystagmus, no ptosis CN V: facial sensation intact CN VII: upper and lower face symmetric CN VIII: hearing intact CN IX, X: gag intact, uvula midline CN XI: sternocleidomastoid and trapezius muscles intact CN XII: tongue midline Bulk & Tone: normal, no fasciculations. Motor:  5/5 throughout  Sensation:  Pinprick and vibration sensation intact.  Deep Tendon Reflexes:  2+ throughout, toes downgoing.   Finger to nose testing:  Without dysmetria.   Heel to shin:  Without dysmetria.   Gait:  Normal station and stride.  Able to turn.  Romberg negative  IMPRESSION: Post-Covid persistent daily headache, complicated by medication overuse  PLAN: 1. Stop Advil and  caffeine. 2.  When headache gets severe, take rizatriptan 10mg .  May repeat after 2 hours if needed.  Maximum 2 tablets in 24 hours. 3.  Limit use of pain relievers to no more than 2 days out of week to prevent risk of rebound or medication-overuse headache. 4.  Start zonisamide 25mg  at bedtime for one week, then 50mg  at bedtime for one week, then 75mg  at bedtime for one week, then 100mg  at bedtime 5.  Follow up in 4 months.  Thank you for allowing me to take part in the care of this patient.  Metta Clines, DO  CC: Donald Prose, MD

## 2019-04-11 NOTE — Patient Instructions (Signed)
1. Stop Advil and caffeine. 2.  When headache gets severe, take rizatriptan 10mg .  May repeat after 2 hours if needed.  Maximum 2 tablets in 24 hours. 3.  Limit use of pain relievers to no more than 2 days out of week to prevent risk of rebound or medication-overuse headache. 4.  Start zonisamide 25mg  at bedtime for one week, then 50mg  at bedtime for one week, then 75mg  at bedtime for one week, then 100mg  at bedtime 5.  Follow up in 4 months.

## 2019-05-01 DIAGNOSIS — L732 Hidradenitis suppurativa: Secondary | ICD-10-CM | POA: Diagnosis not present

## 2019-05-10 ENCOUNTER — Other Ambulatory Visit: Payer: Self-pay | Admitting: Neurology

## 2019-06-12 DIAGNOSIS — Z01419 Encounter for gynecological examination (general) (routine) without abnormal findings: Secondary | ICD-10-CM | POA: Diagnosis not present

## 2019-06-12 DIAGNOSIS — Z681 Body mass index (BMI) 19 or less, adult: Secondary | ICD-10-CM | POA: Diagnosis not present

## 2019-07-19 DIAGNOSIS — F900 Attention-deficit hyperactivity disorder, predominantly inattentive type: Secondary | ICD-10-CM | POA: Diagnosis not present

## 2019-08-15 NOTE — Progress Notes (Deleted)
NEUROLOGY FOLLOW UP OFFICE NOTE  Mackenzie Anderson [id number]  HISTORY OF PRESENT ILLNESS: Mackenzie Mottley. Anderson is a 34 year old female with asthma, ADD, depression, anxiety and Raynaud's who follows up for headache.  UPDATE: Intensity:  *** Duration:  *** Frequency:  *** Frequency of abortive medication: *** Current NSAIDS:  *** Current analgesics:  *** Current triptans:  Rizatriptan 10mg  Current ergotamine:  *** Current anti-emetic:  *** Current muscle relaxants:  *** Current anti-anxiolytic:  *** Current sleep aide:  *** Current Antihypertensive medications:  *** Current Antidepressant medications:  *** Current Anticonvulsant medications:  *** Current anti-CGRP:  *** Current Vitamins/Herbal/Supplements:  *** Current Antihistamines/Decongestants:  *** Other therapy:  *** Other medication:  ***  Caffeine:  She will drink an energy drink with 120mg  of caffeine or cup of coffee Diet:  32 oz water daily.  Does not skip meals. Exercise:  Routine Depression:  no; Anxiety:  A little Other pain:  Some mild posterior neck pain Sleep:  Varies.  Somewhat improved after sinus surgery and use of melatonin.  Wakes up a couple of times a night but able to fall back asleep.  HISTORY: She was diagnosed with COVID-19 in late December after presenting with headache, loss of taste and smell and mild shortness of breath.  Symptoms lasted a couple of days.  She noted some nasal congestion and followed up with Dr. , ENT, who performed a septoplasty in September 2020 and was found to have no sinus infection.  However, she was prescribed a prednisone taper on 03/01/2019, which was ineffective.  Headaches were initially severe but now more mild pressure in the front and back of head, which has been pretty much persistent.  Upright makes it worse, laying down makes it better.  Maybe some associated photosensitivity and occasional nausea but no visual disturbance, phonophobia.  She has been  treating daily with Advil and caffeine 120mg .    Past rescue:  Advil, caffeine  She denies history of headaches.  Family history of headache:  Mother (migraines)  PAST MEDICAL HISTORY: Past Medical History:  Diagnosis Date  . Abnormal Pap smear   . Anxiety   . Asthma   . Headache(784.0)   . Infection    gonorrhea   . Raynaud's disease     MEDICATIONS: Current Outpatient Medications on File Prior to Visit  Medication Sig Dispense Refill  . acetaminophen (TYLENOL) 325 MG tablet Take 650 mg by mouth every 6 (six) hours as needed.    . ALBUTEROL IN Inhale into the lungs.    October 2020 amphetamine-dextroamphetamine (ADDERALL) 15 MG tablet Take 15 mg by mouth daily.    03/03/2019 BIOTIN PO Take by mouth.    . clonazePAM (KLONOPIN) 0.5 MG tablet Take 0.5 mg by mouth at bedtime.    . rizatriptan (MAXALT) 10 MG tablet Take 1 tablet at earliest onset for headache.  May repeat in 2 hours if needed. Maximum 2 tablets in 24 hours. 10 tablet 5  . zonisamide (ZONEGRAN) 25 MG capsule TAKE 1 CAPSULE AT BEDTIME FOR ONE WEEK, THEN 2 CAPSULES AT BEDTIME FOR ONE WEEK, THEN 3 CAPSULES AT BEDTIME FOR ONE WEEK, THEN 4 CAPSULES AT BEDTIME. 120 capsule 0   No current facility-administered medications on file prior to visit.    ALLERGIES: Allergies  Allergen Reactions  . Hydrocodone Nausea Only and Other (See Comments)    Lethargy  . Other Other (See Comments)    Mango - Blisters around mouth    FAMILY HISTORY: No  family history on file. ***.  SOCIAL HISTORY: Social History   Socioeconomic History  . Marital status: Single    Spouse name: Not on file  . Number of children: 0  . Years of education: 68  . Highest education level: Not on file  Occupational History  . Not on file  Tobacco Use  . Smoking status: Former Games developer  . Smokeless tobacco: Never Used  . Tobacco comment: last Smoked early 20's  Vaping Use  . Vaping Use: Former  Substance and Sexual Activity  . Alcohol use: Yes    Comment:  socially   . Drug use: No  . Sexual activity: Yes    Birth control/protection: None  Other Topics Concern  . Not on file  Social History Narrative   Right handed   One story home   Drinks caffeine    Social Determinants of Health   Financial Resource Strain:   . Difficulty of Paying Living Expenses:   Food Insecurity:   . Worried About Programme researcher, broadcasting/film/video in the Last Year:   . Barista in the Last Year:   Transportation Needs:   . Freight forwarder (Medical):   Marland Kitchen Lack of Transportation (Non-Medical):   Physical Activity:   . Days of Exercise per Week:   . Minutes of Exercise per Session:   Stress:   . Feeling of Stress :   Social Connections:   . Frequency of Communication with Friends and Family:   . Frequency of Social Gatherings with Friends and Family:   . Attends Religious Services:   . Active Member of Clubs or Organizations:   . Attends Banker Meetings:   Marland Kitchen Marital Status:   Intimate Partner Violence:   . Fear of Current or Ex-Partner:   . Emotionally Abused:   Marland Kitchen Physically Abused:   . Sexually Abused:     PHYSICAL EXAM: *** General: No acute distress.  Patient appears well-groomed.   Head:  Normocephalic/atraumatic Eyes:  Fundi examined but not visualized Neck: supple, no paraspinal tenderness, full range of motion Heart:  Regular rate and rhythm Lungs:  Clear to auscultation bilaterally Back: No paraspinal tenderness Neurological Exam: alert and oriented to person, place, and time. Attention span and concentration intact, recent and remote memory intact, fund of knowledge intact.  Speech fluent and not dysarthric, language intact.  CN II-XII intact. Bulk and tone normal, muscle strength 5/5 throughout.  Sensation to light touch, temperature and vibration intact.  Deep tendon reflexes 2+ throughout, toes downgoing.  Finger to nose and heel to shin testing intact.  Gait normal, Romberg negative.  IMPRESSION: ***  PLAN: 1.  For  preventative management, *** 2.  For abortive therapy, *** 3.  Limit use of pain relievers to no more than 2 days out of week to prevent risk of rebound or medication-overuse headache. 4.  Keep headache diary 5.  Exercise, hydration, caffeine cessation, sleep hygiene, monitor for and avoid triggers 6. Follow up ***   Shon Millet, DO  CC: Deatra James, MD

## 2019-08-16 ENCOUNTER — Ambulatory Visit: Payer: BC Managed Care – PPO | Admitting: Neurology

## 2019-09-17 DIAGNOSIS — L920 Granuloma annulare: Secondary | ICD-10-CM | POA: Diagnosis not present

## 2019-10-05 DIAGNOSIS — Z20822 Contact with and (suspected) exposure to covid-19: Secondary | ICD-10-CM | POA: Diagnosis not present

## 2019-12-25 DIAGNOSIS — Z20822 Contact with and (suspected) exposure to covid-19: Secondary | ICD-10-CM | POA: Diagnosis not present

## 2020-02-08 DIAGNOSIS — Z1152 Encounter for screening for COVID-19: Secondary | ICD-10-CM | POA: Diagnosis not present

## 2020-03-02 DIAGNOSIS — F900 Attention-deficit hyperactivity disorder, predominantly inattentive type: Secondary | ICD-10-CM | POA: Diagnosis not present

## 2020-03-02 DIAGNOSIS — F411 Generalized anxiety disorder: Secondary | ICD-10-CM | POA: Diagnosis not present

## 2020-03-09 DIAGNOSIS — K219 Gastro-esophageal reflux disease without esophagitis: Secondary | ICD-10-CM | POA: Diagnosis not present

## 2020-03-09 DIAGNOSIS — J301 Allergic rhinitis due to pollen: Secondary | ICD-10-CM | POA: Diagnosis not present

## 2020-03-09 DIAGNOSIS — J3089 Other allergic rhinitis: Secondary | ICD-10-CM | POA: Diagnosis not present

## 2020-03-09 DIAGNOSIS — H1045 Other chronic allergic conjunctivitis: Secondary | ICD-10-CM | POA: Diagnosis not present

## 2020-05-29 DIAGNOSIS — R590 Localized enlarged lymph nodes: Secondary | ICD-10-CM | POA: Diagnosis not present

## 2020-05-29 DIAGNOSIS — J069 Acute upper respiratory infection, unspecified: Secondary | ICD-10-CM | POA: Diagnosis not present

## 2020-05-29 DIAGNOSIS — Z03818 Encounter for observation for suspected exposure to other biological agents ruled out: Secondary | ICD-10-CM | POA: Diagnosis not present

## 2020-06-08 DIAGNOSIS — J029 Acute pharyngitis, unspecified: Secondary | ICD-10-CM | POA: Diagnosis not present

## 2021-02-26 ENCOUNTER — Other Ambulatory Visit: Payer: Self-pay | Admitting: Family Medicine

## 2021-02-26 ENCOUNTER — Ambulatory Visit
Admission: RE | Admit: 2021-02-26 | Discharge: 2021-02-26 | Disposition: A | Discharge status: Home / Self Care | Payer: No Typology Code available for payment source | Source: Ambulatory Visit | Attending: Family Medicine | Admitting: Family Medicine

## 2021-02-26 DIAGNOSIS — M79674 Pain in right toe(s): Secondary | ICD-10-CM

## 2021-10-11 DIAGNOSIS — F900 Attention-deficit hyperactivity disorder, predominantly inattentive type: Secondary | ICD-10-CM | POA: Diagnosis not present

## 2021-10-11 DIAGNOSIS — M24849 Other specific joint derangements of unspecified hand, not elsewhere classified: Secondary | ICD-10-CM | POA: Diagnosis not present

## 2021-11-29 DIAGNOSIS — Z681 Body mass index (BMI) 19 or less, adult: Secondary | ICD-10-CM | POA: Diagnosis not present

## 2021-11-29 DIAGNOSIS — Z01419 Encounter for gynecological examination (general) (routine) without abnormal findings: Secondary | ICD-10-CM | POA: Diagnosis not present

## 2021-12-22 DIAGNOSIS — J069 Acute upper respiratory infection, unspecified: Secondary | ICD-10-CM | POA: Diagnosis not present

## 2021-12-22 DIAGNOSIS — H66001 Acute suppurative otitis media without spontaneous rupture of ear drum, right ear: Secondary | ICD-10-CM | POA: Diagnosis not present

## 2021-12-22 DIAGNOSIS — Z6820 Body mass index (BMI) 20.0-20.9, adult: Secondary | ICD-10-CM | POA: Diagnosis not present

## 2022-01-18 DIAGNOSIS — Z20828 Contact with and (suspected) exposure to other viral communicable diseases: Secondary | ICD-10-CM | POA: Diagnosis not present

## 2022-01-18 DIAGNOSIS — R059 Cough, unspecified: Secondary | ICD-10-CM | POA: Diagnosis not present

## 2022-01-18 DIAGNOSIS — Z03818 Encounter for observation for suspected exposure to other biological agents ruled out: Secondary | ICD-10-CM | POA: Diagnosis not present

## 2022-03-11 DIAGNOSIS — J209 Acute bronchitis, unspecified: Secondary | ICD-10-CM | POA: Diagnosis not present

## 2022-03-11 DIAGNOSIS — F900 Attention-deficit hyperactivity disorder, predominantly inattentive type: Secondary | ICD-10-CM | POA: Diagnosis not present

## 2022-04-20 DIAGNOSIS — Z3169 Encounter for other general counseling and advice on procreation: Secondary | ICD-10-CM | POA: Diagnosis not present

## 2022-05-30 DIAGNOSIS — Z31430 Encounter of female for testing for genetic disease carrier status for procreative management: Secondary | ICD-10-CM | POA: Diagnosis not present

## 2022-05-30 DIAGNOSIS — Z319 Encounter for procreative management, unspecified: Secondary | ICD-10-CM | POA: Diagnosis not present

## 2022-05-30 DIAGNOSIS — N971 Female infertility of tubal origin: Secondary | ICD-10-CM | POA: Diagnosis not present

## 2022-05-30 DIAGNOSIS — Z3161 Procreative counseling and advice using natural family planning: Secondary | ICD-10-CM | POA: Diagnosis not present

## 2022-05-30 DIAGNOSIS — E559 Vitamin D deficiency, unspecified: Secondary | ICD-10-CM | POA: Diagnosis not present

## 2022-05-30 DIAGNOSIS — E288 Other ovarian dysfunction: Secondary | ICD-10-CM | POA: Diagnosis not present

## 2022-05-31 DIAGNOSIS — F900 Attention-deficit hyperactivity disorder, predominantly inattentive type: Secondary | ICD-10-CM | POA: Diagnosis not present

## 2022-06-02 DIAGNOSIS — N979 Female infertility, unspecified: Secondary | ICD-10-CM | POA: Diagnosis not present

## 2022-06-02 DIAGNOSIS — Z3202 Encounter for pregnancy test, result negative: Secondary | ICD-10-CM | POA: Diagnosis not present

## 2022-06-02 DIAGNOSIS — Z3189 Encounter for other procreative management: Secondary | ICD-10-CM | POA: Diagnosis not present

## 2022-06-02 DIAGNOSIS — Z3141 Encounter for fertility testing: Secondary | ICD-10-CM | POA: Diagnosis not present

## 2022-06-24 DIAGNOSIS — Z3189 Encounter for other procreative management: Secondary | ICD-10-CM | POA: Diagnosis not present

## 2022-06-24 DIAGNOSIS — N979 Female infertility, unspecified: Secondary | ICD-10-CM | POA: Diagnosis not present

## 2022-07-01 DIAGNOSIS — N979 Female infertility, unspecified: Secondary | ICD-10-CM | POA: Diagnosis not present

## 2022-07-01 DIAGNOSIS — Z3189 Encounter for other procreative management: Secondary | ICD-10-CM | POA: Diagnosis not present

## 2022-07-05 DIAGNOSIS — N979 Female infertility, unspecified: Secondary | ICD-10-CM | POA: Diagnosis not present

## 2022-07-05 DIAGNOSIS — Z3189 Encounter for other procreative management: Secondary | ICD-10-CM | POA: Diagnosis not present

## 2022-07-25 DIAGNOSIS — Z3189 Encounter for other procreative management: Secondary | ICD-10-CM | POA: Diagnosis not present

## 2022-07-25 DIAGNOSIS — N979 Female infertility, unspecified: Secondary | ICD-10-CM | POA: Diagnosis not present

## 2022-07-27 DIAGNOSIS — Z3200 Encounter for pregnancy test, result unknown: Secondary | ICD-10-CM | POA: Diagnosis not present

## 2022-07-29 DIAGNOSIS — J069 Acute upper respiratory infection, unspecified: Secondary | ICD-10-CM | POA: Diagnosis not present

## 2022-08-08 DIAGNOSIS — Z3200 Encounter for pregnancy test, result unknown: Secondary | ICD-10-CM | POA: Diagnosis not present

## 2022-08-22 DIAGNOSIS — O0900 Supervision of pregnancy with history of infertility, unspecified trimester: Secondary | ICD-10-CM | POA: Diagnosis not present

## 2022-09-02 DIAGNOSIS — O209 Hemorrhage in early pregnancy, unspecified: Secondary | ICD-10-CM | POA: Diagnosis not present

## 2022-09-02 DIAGNOSIS — Z3A01 Less than 8 weeks gestation of pregnancy: Secondary | ICD-10-CM | POA: Diagnosis not present

## 2022-09-06 DIAGNOSIS — Z3481 Encounter for supervision of other normal pregnancy, first trimester: Secondary | ICD-10-CM | POA: Diagnosis not present

## 2022-09-06 DIAGNOSIS — Z36 Encounter for antenatal screening for chromosomal anomalies: Secondary | ICD-10-CM | POA: Diagnosis not present

## 2022-09-06 DIAGNOSIS — O09811 Supervision of pregnancy resulting from assisted reproductive technology, first trimester: Secondary | ICD-10-CM | POA: Diagnosis not present

## 2022-09-06 DIAGNOSIS — Z3689 Encounter for other specified antenatal screening: Secondary | ICD-10-CM | POA: Diagnosis not present

## 2022-09-06 DIAGNOSIS — O09521 Supervision of elderly multigravida, first trimester: Secondary | ICD-10-CM | POA: Diagnosis not present

## 2022-09-06 DIAGNOSIS — Z3A10 10 weeks gestation of pregnancy: Secondary | ICD-10-CM | POA: Diagnosis not present

## 2022-09-06 LAB — OB RESULTS CONSOLE RPR: RPR: NONREACTIVE

## 2022-09-06 LAB — OB RESULTS CONSOLE HIV ANTIBODY (ROUTINE TESTING): HIV: NONREACTIVE

## 2022-09-06 LAB — OB RESULTS CONSOLE HEPATITIS B SURFACE ANTIGEN: Hepatitis B Surface Ag: NEGATIVE

## 2022-09-06 LAB — OB RESULTS CONSOLE RUBELLA ANTIBODY, IGM: Rubella: IMMUNE

## 2022-09-06 LAB — OB RESULTS CONSOLE ANTIBODY SCREEN: Antibody Screen: NEGATIVE

## 2022-09-27 DIAGNOSIS — Z118 Encounter for screening for other infectious and parasitic diseases: Secondary | ICD-10-CM | POA: Diagnosis not present

## 2022-09-27 LAB — OB RESULTS CONSOLE GC/CHLAMYDIA
Chlamydia: NEGATIVE
Neisseria Gonorrhea: NEGATIVE

## 2022-10-13 DIAGNOSIS — R519 Headache, unspecified: Secondary | ICD-10-CM | POA: Diagnosis not present

## 2022-10-13 DIAGNOSIS — M545 Low back pain, unspecified: Secondary | ICD-10-CM | POA: Diagnosis not present

## 2022-10-20 DIAGNOSIS — Z361 Encounter for antenatal screening for raised alphafetoprotein level: Secondary | ICD-10-CM | POA: Diagnosis not present

## 2022-10-20 DIAGNOSIS — Z3689 Encounter for other specified antenatal screening: Secondary | ICD-10-CM | POA: Diagnosis not present

## 2022-11-09 DIAGNOSIS — Z3A19 19 weeks gestation of pregnancy: Secondary | ICD-10-CM | POA: Diagnosis not present

## 2022-11-09 DIAGNOSIS — O09522 Supervision of elderly multigravida, second trimester: Secondary | ICD-10-CM | POA: Diagnosis not present

## 2022-12-28 DIAGNOSIS — O09522 Supervision of elderly multigravida, second trimester: Secondary | ICD-10-CM | POA: Diagnosis not present

## 2022-12-28 DIAGNOSIS — Z3A26 26 weeks gestation of pregnancy: Secondary | ICD-10-CM | POA: Diagnosis not present

## 2022-12-28 DIAGNOSIS — Z3689 Encounter for other specified antenatal screening: Secondary | ICD-10-CM | POA: Diagnosis not present

## 2023-01-11 DIAGNOSIS — Z3689 Encounter for other specified antenatal screening: Secondary | ICD-10-CM | POA: Diagnosis not present

## 2023-01-23 DIAGNOSIS — N898 Other specified noninflammatory disorders of vagina: Secondary | ICD-10-CM | POA: Diagnosis not present

## 2023-02-01 NOTE — L&D Delivery Note (Signed)
 Delivery Note At 11:09 PM a {baby status:3041425} female was delivered via Vaginal, Spontaneous (Presentation: Right Occiput Anterior).  APGAR: , ; weight  .   Placenta status: Spontaneous, Intact.  Cord: 3 vessels with the following complications: None.  Cord pH: ***  Anesthesia: Epidural Episiotomy: None Lacerations: 2nd degree Suture Repair: {suture ZOXW:[id number]} Est. Blood Loss (mL):    Mom to {mom status:3041454}.  Baby to {baby status:3041455}.  Mackenzie Anderson A [name] 04/01/2023, 11:40 PM

## 2023-03-07 LAB — OB RESULTS CONSOLE GBS: GBS: NEGATIVE

## 2023-03-31 ENCOUNTER — Encounter (HOSPITAL_COMMUNITY): Payer: Self-pay

## 2023-03-31 ENCOUNTER — Other Ambulatory Visit: Payer: Self-pay

## 2023-03-31 ENCOUNTER — Encounter (HOSPITAL_COMMUNITY): Payer: Self-pay | Admitting: *Deleted

## 2023-03-31 ENCOUNTER — Inpatient Hospital Stay (HOSPITAL_COMMUNITY): Admission: RE | Admit: 2023-03-31 | Payer: 59 | Source: Home / Self Care | Admitting: Obstetrics and Gynecology

## 2023-03-31 ENCOUNTER — Inpatient Hospital Stay (HOSPITAL_COMMUNITY)
Admission: AD | Admit: 2023-03-31 | Discharge: 2023-04-03 | DRG: 807 | Disposition: A | Discharge status: Home / Self Care | Payer: 59 | Attending: Obstetrics and Gynecology | Admitting: Obstetrics and Gynecology

## 2023-03-31 ENCOUNTER — Encounter (HOSPITAL_COMMUNITY): Payer: Self-pay | Admitting: Obstetrics and Gynecology

## 2023-03-31 ENCOUNTER — Telehealth (HOSPITAL_COMMUNITY): Payer: Self-pay | Admitting: *Deleted

## 2023-03-31 DIAGNOSIS — F419 Anxiety disorder, unspecified: Secondary | ICD-10-CM | POA: Diagnosis present

## 2023-03-31 DIAGNOSIS — Z3A40 40 weeks gestation of pregnancy: Secondary | ICD-10-CM | POA: Diagnosis not present

## 2023-03-31 DIAGNOSIS — O99344 Other mental disorders complicating childbirth: Secondary | ICD-10-CM | POA: Diagnosis present

## 2023-03-31 DIAGNOSIS — Z23 Encounter for immunization: Secondary | ICD-10-CM

## 2023-03-31 DIAGNOSIS — K219 Gastro-esophageal reflux disease without esophagitis: Secondary | ICD-10-CM | POA: Diagnosis present

## 2023-03-31 DIAGNOSIS — O26893 Other specified pregnancy related conditions, third trimester: Secondary | ICD-10-CM | POA: Diagnosis present

## 2023-03-31 DIAGNOSIS — O9962 Diseases of the digestive system complicating childbirth: Secondary | ICD-10-CM | POA: Diagnosis present

## 2023-03-31 DIAGNOSIS — O4292 Full-term premature rupture of membranes, unspecified as to length of time between rupture and onset of labor: Secondary | ICD-10-CM | POA: Diagnosis present

## 2023-03-31 DIAGNOSIS — Z87891 Personal history of nicotine dependence: Secondary | ICD-10-CM | POA: Diagnosis not present

## 2023-03-31 DIAGNOSIS — O4290 Premature rupture of membranes, unspecified as to length of time between rupture and onset of labor, unspecified weeks of gestation: Secondary | ICD-10-CM | POA: Diagnosis not present

## 2023-03-31 LAB — CBC
HCT: 38.5 % (ref 36.0–46.0)
Hemoglobin: 13.3 g/dL (ref 12.0–15.0)
MCH: 30 pg (ref 26.0–34.0)
MCHC: 34.5 g/dL (ref 30.0–36.0)
MCV: 86.7 fL (ref 80.0–100.0)
Platelets: 190 10*3/uL (ref 150–400)
RBC: 4.44 MIL/uL (ref 3.87–5.11)
RDW: 12.7 % (ref 11.5–15.5)
WBC: 10.8 10*3/uL — ABNORMAL HIGH (ref 4.0–10.5)
nRBC: 0 % (ref 0.0–0.2)

## 2023-03-31 LAB — POCT FERN TEST: POCT Fern Test: POSITIVE

## 2023-03-31 MED ORDER — OXYCODONE-ACETAMINOPHEN 5-325 MG PO TABS: 1.0000 | ORAL_TABLET | ORAL | Status: DC | PRN

## 2023-03-31 MED ORDER — ONDANSETRON HCL 4 MG/2ML IJ SOLN: 4.0000 mg | Freq: Four times a day (QID) | INTRAMUSCULAR | Status: DC | PRN

## 2023-03-31 MED ORDER — LACTATED RINGERS IV SOLN: INTRAVENOUS | Status: AC

## 2023-03-31 MED ORDER — OXYCODONE-ACETAMINOPHEN 5-325 MG PO TABS: 2.0000 | ORAL_TABLET | ORAL | Status: DC | PRN

## 2023-03-31 MED ORDER — ACETAMINOPHEN 325 MG PO TABS
650.0000 mg | ORAL_TABLET | ORAL | Status: DC | PRN
  Administered 2023-04-02: 650 mg via ORAL
  Filled 2023-03-31: qty 2

## 2023-03-31 MED ORDER — OXYTOCIN BOLUS FROM INFUSION
333.0000 mL | Freq: Once | INTRAVENOUS | Status: AC
  Administered 2023-04-01: 333 mL via INTRAVENOUS

## 2023-03-31 MED ORDER — LIDOCAINE HCL (PF) 1 % IJ SOLN: 30.0000 mL | INTRAMUSCULAR | Status: DC | PRN

## 2023-03-31 MED ORDER — OXYTOCIN-SODIUM CHLORIDE 30-0.9 UT/500ML-% IV SOLN: 2.5000 [IU]/h | INTRAVENOUS | Status: DC

## 2023-03-31 MED ORDER — SOD CITRATE-CITRIC ACID 500-334 MG/5ML PO SOLN: 30.0000 mL | ORAL | Status: DC | PRN

## 2023-03-31 MED ORDER — FLEET ENEMA RE ENEM: 1.0000 | ENEMA | RECTAL | Status: DC | PRN

## 2023-03-31 MED ORDER — LACTATED RINGERS IV SOLN: 500.0000 mL | INTRAVENOUS | Status: AC | PRN

## 2023-03-31 NOTE — Telephone Encounter (Signed)
 Preadmission screen

## 2023-03-31 NOTE — MAU Note (Signed)

## 2023-03-31 NOTE — MAU Note (Signed)
 Pt says SROM at 620pm- had gush - yellow with yellow particles. UC's started at 1 pm. Denies HSV GBS- neg FHR- 150. Tues - VE - closed

## 2023-03-31 NOTE — H&P (Addendum)
 Mackenzie Anderson is a 38 y.o. female presenting for SROM mec at 1820 with regular contractions.. OB History     Gravida  1   Para      Term      Preterm      AB      Living         SAB      IAB      Ectopic      Multiple      Live Births             Past Medical History:  Diagnosis Date   Abnormal Pap smear    Anxiety    Asthma    Headache(784.0)    Infection    gonorrhea    Raynaud's disease    Past Surgical History:  Procedure Laterality Date   DILATION AND CURETTAGE OF UTERUS     WISDOM TOOTH EXTRACTION  2008   Family History: family history is not on file. Social History:  reports that she has quit smoking. She has never used smokeless tobacco. She reports that she does not currently use alcohol. She reports that she does not use drugs.     Maternal Diabetes: No Genetic Screening: Normal Maternal Ultrasounds/Referrals: Normal Fetal Ultrasounds or other Referrals:  None Maternal Substance Abuse:  No Significant Maternal Medications:  None Significant Maternal Lab Results:  Group B Strep negative Number of Prenatal Visits:greater than 3 verified prenatal visits Maternal Vaccinations:TDap Other Comments:  None  Review of Systems Maternal Medical History:  Reason for admission: Rupture of membranes.   Contractions: Onset was 3-5 hours ago.   Frequency: regular.   Perceived severity is moderate.   Fetal activity: Perceived fetal activity is normal.   Last perceived fetal movement was within the past hour.   Prenatal complications: no prenatal complications Prenatal Complications - Diabetes: none.   Dilation: Fingertip Effacement (%): 50 Station: -2 Exam by:: A English as a second language teacher Blood pressure 115/86, pulse 90, temperature 97.9 F (36.6 C), temperature source Oral, resp. rate 14, height 5' 6.75" (1.695 m), weight 71.1 kg, SpO2 99%. Maternal Exam:  Uterine Assessment: Contraction strength is moderate.  Contraction frequency is regular.   Abdomen: Patient reports no abdominal tenderness. Fetal presentation: vertex Introitus: Normal vulva. Normal vagina.  Ferning test: positive.  Nitrazine test: positive. Amniotic fluid character: meconium stained. Pelvis: adequate for delivery.     Physical Exam Vitals and nursing note reviewed.  Constitutional:      Appearance: Normal appearance. She is normal weight.  HENT:     Head: Normocephalic and atraumatic.  Cardiovascular:     Rate and Rhythm: Normal rate and regular rhythm.     Pulses: Normal pulses.     Heart sounds: Normal heart sounds.  Pulmonary:     Breath sounds: Normal breath sounds.  Abdominal:     General: Bowel sounds are normal.     Palpations: Abdomen is soft.  Genitourinary:    General: Normal vulva.  Musculoskeletal:        General: Normal range of motion.     Cervical back: Normal range of motion and neck supple.  Skin:    General: Skin is warm and dry.  Neurological:     General: No focal deficit present.     Mental Status: She is alert and oriented to person, place, and time.  Psychiatric:        Mood and Affect: Mood normal.        Behavior: Behavior normal.  Prenatal labs: ABO, Rh:   Antibody: Negative (08/06 0000) Rubella: Immune (08/06 0000) RPR: Nonreactive (08/06 0000)  HBsAg: Negative (08/06 0000)  HIV: Non-reactive (08/06 0000)  GBS: Negative/-- (02/04 0000)   Assessment/Plan: Term SROM MSF Early labor Admit , epidural prn   Euclide Granito J 03/31/2023, 9:31 PM

## 2023-04-01 ENCOUNTER — Inpatient Hospital Stay (HOSPITAL_COMMUNITY): Admitting: Anesthesiology

## 2023-04-01 LAB — RPR: RPR Ser Ql: NONREACTIVE

## 2023-04-01 MED ORDER — TERBUTALINE SULFATE 1 MG/ML IJ SOLN: 0.2500 mg | Freq: Once | INTRAMUSCULAR | Status: DC | PRN

## 2023-04-01 MED ORDER — EPHEDRINE 5 MG/ML INJ: 10.0000 mg | INTRAVENOUS | Status: DC | PRN

## 2023-04-01 MED ORDER — PHENYLEPHRINE 80 MCG/ML (10ML) SYRINGE FOR IV PUSH (FOR BLOOD PRESSURE SUPPORT): 80.0000 ug | PREFILLED_SYRINGE | INTRAVENOUS | Status: DC | PRN

## 2023-04-01 MED ORDER — LIDOCAINE HCL (PF) 1 % IJ SOLN
INTRAMUSCULAR | Status: DC | PRN
  Administered 2023-04-01 (×2): 5 mL via EPIDURAL

## 2023-04-01 MED ORDER — OXYTOCIN-SODIUM CHLORIDE 30-0.9 UT/500ML-% IV SOLN
1.0000 m[IU]/min | INTRAVENOUS | Status: DC
  Administered 2023-04-01: 2 m[IU]/min via INTRAVENOUS
  Filled 2023-04-01: qty 500

## 2023-04-01 MED ORDER — LACTATED RINGERS IV SOLN: 500.0000 mL | Freq: Once | INTRAVENOUS | Status: DC

## 2023-04-01 MED ORDER — DIPHENHYDRAMINE HCL 50 MG/ML IJ SOLN: 12.5000 mg | INTRAMUSCULAR | Status: DC | PRN

## 2023-04-01 MED ORDER — FENTANYL-BUPIVACAINE-NACL 0.5-0.125-0.9 MG/250ML-% EP SOLN
12.0000 mL/h | EPIDURAL | Status: DC | PRN
  Administered 2023-04-01: 12 mL/h via EPIDURAL
  Filled 2023-04-01: qty 250

## 2023-04-01 MED ORDER — TRANEXAMIC ACID-NACL 1000-0.7 MG/100ML-% IV SOLN
1000.0000 mg | Freq: Once | INTRAVENOUS | Status: AC
  Administered 2023-04-01: 1000 mg via INTRAVENOUS
  Filled 2023-04-01: qty 100

## 2023-04-01 NOTE — Progress Notes (Signed)
 Mackenzie Anderson is a 38 y.o. G2P0 at [redacted]w[redacted]d by LMP admitted for rupture of membranes  Subjective: Rates pain a 7.  Objective: BP 99/72   Pulse (!) 117   Temp 98.9 F (37.2 C) (Oral)   Resp 16   Ht 5' 6.75" (1.695 m)   Wt 71.1 kg   SpO2 99%   BMI 24.73 kg/m  No intake/output data recorded. No intake/output data recorded.  FHT:  FHR: 125 bpm, variability: moderate,  accelerations:  Present,  decelerations:  Absent UC:   regular, every 5 minutes SVE:   Dilation: 2 Effacement (%): 70 Station: -1 Exam by:: Lovenia Shuck, RN Minimal change in active phase  Labs: Lab Results  Component Value Date   WBC 10.8 (H) 03/31/2023   HGB 13.3 03/31/2023   HCT 38.5 03/31/2023   MCV 86.7 03/31/2023   PLT 190 03/31/2023    Assessment / Plan: Term SROM Minimal change in latent phase MSF- described as moderate per MAU  Labor: slow progress in active phase, augmentation recommended. Preeclampsia:  no signs or symptoms of toxicity Fetal Wellbeing:  Category I Pain Control:  Labor support without medications I/D:  n/a Anticipated MOD:  NSVD  Lenoard Aden, MD 04/01/2023, 7:51 AM

## 2023-04-01 NOTE — Progress Notes (Signed)
 Labor Progress Note  S/O: Pt now comfortable with epidural  Vitals:   04/01/23 1552 04/01/23 1625  BP: 113/77   Pulse: 64   Resp:    Temp:    SpO2: 99% 99%   SVE: 4-5/100/0 to +1  EFM: Cat I baseline 120 bpm mod var +accels Toco: irregular ctxs q 4-6 min  A/P: 37Y G2P0010 @ [redacted]w[redacted]d labor  -SROM for moderate meconium since 1820 yesterday, Pt agreed to and Pitocin started at 11:20 AM now at 6mU and now making progress. Will cont with Pitocin titration as needed -GBS NEG -Cont EFM/Toco: Cat I -Epidural labor pain mgmt -Routine intrapartum care -Anticipate SVD  Evin Chirco A Roshawnda Pecora 04/01/23

## 2023-04-01 NOTE — Anesthesia Preprocedure Evaluation (Signed)
 Anesthesia Evaluation  Patient identified by MRN, date of birth, ID band Patient awake    Reviewed: Allergy & Precautions, Patient's Chart, lab work & pertinent test results  Airway Mallampati: II       Dental no notable dental hx.    Pulmonary asthma , former smoker   Pulmonary exam normal breath sounds clear to auscultation       Cardiovascular negative cardio ROS Normal cardiovascular exam Rhythm:Regular Rate:Normal  Raynaud's disease   Neuro/Psych  Headaches  Anxiety        GI/Hepatic Neg liver ROS,GERD  ,,  Endo/Other  negative endocrine ROS    Renal/GU negative Renal ROS  negative genitourinary   Musculoskeletal negative musculoskeletal ROS (+)    Abdominal   Peds  Hematology negative hematology ROS (+)   Anesthesia Other Findings   Reproductive/Obstetrics (+) Pregnancy                              Anesthesia Physical Anesthesia Plan  ASA: 2  Anesthesia Plan: Epidural   Post-op Pain Management:    Induction:   PONV Risk Score and Plan:   Airway Management Planned: Natural Airway  Additional Equipment: None and Fetal Monitoring  Intra-op Plan:   Post-operative Plan:   Informed Consent: I have reviewed the patients History and Physical, chart, labs and discussed the procedure including the risks, benefits and alternatives for the proposed anesthesia with the patient or authorized representative who has indicated his/her understanding and acceptance.       Plan Discussed with: Anesthesiologist  Anesthesia Plan Comments:          Anesthesia Quick Evaluation

## 2023-04-01 NOTE — Progress Notes (Signed)
 Mackenzie Anderson is a 37 y.o. G2P0 at [redacted]w[redacted]d by LMP admitted for rupture of membranes  Subjective: Contractions spacing out. Will accept Pitocin now.  Objective: BP (!) 94/47   Pulse 74   Temp 98.9 F (37.2 C) (Oral)   Resp 16   Ht 5' 6.75" (1.695 m)   Wt 71.1 kg   SpO2 99%   BMI 24.73 kg/m  No intake/output data recorded. No intake/output data recorded.  FHT:  FHR: 125 bpm, variability: moderate,  accelerations:  Present,  decelerations:  Absent UC:   irregular SVE:   Dilation: 2 Effacement (%): 70 Station: -1 Exam by:: Marylene Buerger Minimal change in active phase  Labs: Lab Results  Component Value Date   WBC 10.8 (H) 03/31/2023   HGB 13.3 03/31/2023   HCT 38.5 03/31/2023   MCV 86.7 03/31/2023   PLT 190 03/31/2023    Assessment / Plan: Term SROM Minimal change in latent phase = has declined augmentation to this point, now accepts due to concerns chorio. MSF- described as moderate per MAU  Labor: slow progress in active phase, augmentation recommended. Preeclampsia:  no signs or symptoms of toxicity Fetal Wellbeing:  Category I Pain Control:  Labor support without medications I/D:  n/a Anticipated MOD:  NSVD  Lenoard Aden, MD 04/01/2023, 10:57 AM

## 2023-04-01 NOTE — Anesthesia Procedure Notes (Signed)
 Epidural Patient location during procedure: OB Start time: 04/01/2023 3:13 PM End time: 04/01/2023 3:22 PM  Staffing Anesthesiologist: Mal Amabile, MD Performed: anesthesiologist   Preanesthetic Checklist Completed: patient identified, IV checked, site marked, risks and benefits discussed, surgical consent, monitors and equipment checked, pre-op evaluation and timeout performed  Epidural Patient position: sitting Prep: DuraPrep and site prepped and draped Patient monitoring: continuous pulse ox and blood pressure Approach: midline Location: L3-L4 Injection technique: LOR air  Needle:  Needle type: Tuohy  Needle gauge: 17 G Needle length: 9 cm and 9 Needle insertion depth: 4 cm Catheter type: closed end flexible Catheter size: 19 Gauge Catheter at skin depth: 9 cm Test dose: negative and Other  Assessment Events: blood not aspirated, no cerebrospinal fluid, injection not painful, no injection resistance, no paresthesia and negative IV test  Additional Notes Patient identified. Risks and benefits discussed including failed block, incomplete  Pain control, post dural puncture headache, nerve damage, paralysis, blood pressure Changes, nausea, vomiting, reactions to medications-both toxic and allergic and post Partum back pain. All questions were answered. Patient expressed understanding and wished to proceed. Sterile technique was used throughout procedure. Epidural site was Dressed with sterile barrier dressing. No paresthesias, signs of intravascular injection Or signs of intrathecal spread were encountered.  Patient was more comfortable after the epidural was dosed. Please see RN's note for documentation of vital signs and FHR which are stable. Reason for block:procedure for pain

## 2023-04-02 ENCOUNTER — Encounter (HOSPITAL_COMMUNITY): Payer: Self-pay | Admitting: Obstetrics and Gynecology

## 2023-04-02 DIAGNOSIS — O4290 Premature rupture of membranes, unspecified as to length of time between rupture and onset of labor, unspecified weeks of gestation: Secondary | ICD-10-CM | POA: Diagnosis not present

## 2023-04-02 LAB — CBC
HCT: 34.8 % — ABNORMAL LOW (ref 36.0–46.0)
Hemoglobin: 11.8 g/dL — ABNORMAL LOW (ref 12.0–15.0)
MCH: 30.1 pg (ref 26.0–34.0)
MCHC: 33.9 g/dL (ref 30.0–36.0)
MCV: 88.8 fL (ref 80.0–100.0)
Platelets: 168 10*3/uL (ref 150–400)
RBC: 3.92 MIL/uL (ref 3.87–5.11)
RDW: 13.1 % (ref 11.5–15.5)
WBC: 17.8 10*3/uL — ABNORMAL HIGH (ref 4.0–10.5)
nRBC: 0 % (ref 0.0–0.2)

## 2023-04-02 LAB — ABO/RH: ABO/RH(D): O POS

## 2023-04-02 MED ORDER — SIMETHICONE 80 MG PO CHEW: 80.0000 mg | CHEWABLE_TABLET | ORAL | Status: DC | PRN

## 2023-04-02 MED ORDER — ACETAMINOPHEN 325 MG PO TABS
650.0000 mg | ORAL_TABLET | ORAL | Status: DC | PRN
  Administered 2023-04-02 – 2023-04-03 (×3): 650 mg via ORAL
  Filled 2023-04-02 (×4): qty 2

## 2023-04-02 MED ORDER — BENZOCAINE-MENTHOL 20-0.5 % EX AERO
1.0000 | INHALATION_SPRAY | CUTANEOUS | Status: DC | PRN
  Administered 2023-04-02: 1 via TOPICAL
  Filled 2023-04-02: qty 56

## 2023-04-02 MED ORDER — DIBUCAINE (PERIANAL) 1 % EX OINT: 1.0000 | TOPICAL_OINTMENT | CUTANEOUS | Status: DC | PRN

## 2023-04-02 MED ORDER — ZOLPIDEM TARTRATE 5 MG PO TABS: 5.0000 mg | ORAL_TABLET | Freq: Every evening | ORAL | Status: DC | PRN

## 2023-04-02 MED ORDER — PRENATAL MULTIVITAMIN CH
1.0000 | ORAL_TABLET | Freq: Every day | ORAL | Status: DC
  Administered 2023-04-02 – 2023-04-03 (×2): 1 via ORAL
  Filled 2023-04-02 (×2): qty 1

## 2023-04-02 MED ORDER — ONDANSETRON HCL 4 MG PO TABS: 4.0000 mg | ORAL_TABLET | ORAL | Status: DC | PRN

## 2023-04-02 MED ORDER — TETANUS-DIPHTH-ACELL PERTUSSIS 5-2.5-18.5 LF-MCG/0.5 IM SUSY
0.5000 mL | PREFILLED_SYRINGE | Freq: Once | INTRAMUSCULAR | Status: AC
  Administered 2023-04-02: 0.5 mL via INTRAMUSCULAR
  Filled 2023-04-02: qty 0.5

## 2023-04-02 MED ORDER — COCONUT OIL OIL: 1.0000 | TOPICAL_OIL | Status: DC | PRN

## 2023-04-02 MED ORDER — WITCH HAZEL-GLYCERIN EX PADS: 1.0000 | MEDICATED_PAD | CUTANEOUS | Status: DC | PRN

## 2023-04-02 MED ORDER — INFLUENZA VIRUS VACC SPLIT PF (FLUZONE) 0.5 ML IM SUSY: 0.5000 mL | PREFILLED_SYRINGE | INTRAMUSCULAR | Status: DC

## 2023-04-02 MED ORDER — IBUPROFEN 600 MG PO TABS
600.0000 mg | ORAL_TABLET | Freq: Four times a day (QID) | ORAL | Status: DC
  Administered 2023-04-02 – 2023-04-03 (×7): 600 mg via ORAL
  Filled 2023-04-02 (×7): qty 1

## 2023-04-02 MED ORDER — ONDANSETRON HCL 4 MG/2ML IJ SOLN: 4.0000 mg | INTRAMUSCULAR | Status: DC | PRN

## 2023-04-02 MED ORDER — SENNOSIDES-DOCUSATE SODIUM 8.6-50 MG PO TABS
2.0000 | ORAL_TABLET | Freq: Every day | ORAL | Status: DC
  Administered 2023-04-02 – 2023-04-03 (×2): 2 via ORAL
  Filled 2023-04-02 (×2): qty 2

## 2023-04-02 MED ORDER — PNEUMOCOCCAL 20-VAL CONJ VACC 0.5 ML IM SUSY
0.5000 mL | PREFILLED_SYRINGE | INTRAMUSCULAR | Status: AC
  Administered 2023-04-03: 0.5 mL via INTRAMUSCULAR
  Filled 2023-04-02: qty 0.5

## 2023-04-02 MED ORDER — DIPHENHYDRAMINE HCL 25 MG PO CAPS: 25.0000 mg | ORAL_CAPSULE | Freq: Four times a day (QID) | ORAL | Status: DC | PRN

## 2023-04-02 NOTE — Progress Notes (Signed)
 Post Partum Day One Subjective: Patient doing ok this morning. Reports feeling sore but manageable. VB has been appropriate. Up and ambulating without dizziness. Spontaneously voiding without difficulties. Tolerating regular diet without N/V. No HA, CP, or SOB.  Baby girl is doing well in NICU now on RA  Objective: Patient Vitals for the past 24 hrs:  BP Temp Temp src Pulse Resp SpO2  04/02/23 0814 98/69 98 F (36.7 C) Oral 86 18 100 %  04/02/23 0459 94/63 97.9 F (36.6 C) Oral 79 17 97 %  04/02/23 0321 98/65 98 F (36.7 C) Oral 87 16 99 %  04/02/23 0158 105/63 (!) 97.5 F (36.4 C) Oral 78 18 100 %  04/02/23 0045 101/66 -- -- 63 -- --  04/02/23 0030 106/69 -- -- 62 -- --  04/02/23 0015 114/68 -- -- 64 -- --  04/02/23 0000 111/61 99.8 F (37.7 C) Axillary 69 19 --  04/01/23 2345 116/77 -- -- 77 -- --  04/01/23 2330 118/85 -- -- 95 -- --  04/01/23 2320 116/70 -- -- 73 -- --  04/01/23 2300 -- -- -- -- -- 99 %  04/01/23 2250 -- -- -- -- -- 99 %  04/01/23 2230 112/68 -- -- 76 -- 99 %  04/01/23 2210 -- -- -- -- -- 99 %  04/01/23 2205 -- -- -- -- -- 100 %  04/01/23 2200 120/72 -- -- 78 -- --  04/01/23 2141 -- 99.1 F (37.3 C) Axillary -- -- --  04/01/23 2138 107/70 -- -- 79 -- --  04/01/23 2118 109/65 -- -- 76 -- --  04/01/23 2100 -- -- -- -- -- 100 %  04/01/23 2030 109/66 -- -- 80 -- 100 %  04/01/23 2015 -- -- -- -- -- 99 %  04/01/23 2000 (!) 101/59 -- -- 67 -- 100 %  04/01/23 1930 108/65 -- -- 74 17 100 %  04/01/23 1925 -- 98.3 F (36.8 C) Axillary -- -- 100 %  04/01/23 1917 -- -- -- -- -- 100 %  04/01/23 1901 105/68 -- -- 73 -- 100 %  04/01/23 1855 -- -- -- -- -- 100 %  04/01/23 1850 -- -- -- -- -- 97 %  04/01/23 1845 -- -- -- -- -- 100 %  04/01/23 1840 -- -- -- -- -- 92 %  04/01/23 1835 -- -- -- -- -- 99 %  04/01/23 1830 100/61 -- -- 71 -- 100 %  04/01/23 1820 -- -- -- -- -- 100 %  04/01/23 1815 -- -- -- -- -- 100 %  04/01/23 1810 -- -- -- -- -- 98 %  04/01/23 1800  105/67 -- -- 88 -- --  04/01/23 1751 -- 99 F (37.2 C) Axillary -- -- --  04/01/23 1730 (!) 97/58 -- -- 74 -- --  04/01/23 1720 -- -- -- -- -- 99 %  04/01/23 1715 -- -- -- -- -- 100 %  04/01/23 1710 -- -- -- -- -- 100 %  04/01/23 1705 -- -- -- -- -- 99 %  04/01/23 1700 (!) 93/58 -- -- 61 16 --  04/01/23 1655 -- -- -- -- -- 99 %  04/01/23 1650 -- -- -- -- -- 100 %  04/01/23 1645 -- -- -- -- -- 99 %  04/01/23 1640 -- -- -- -- -- 100 %  04/01/23 1635 -- -- -- -- -- 100 %  04/01/23 1630 (!) 103/59 -- -- 69 -- 100 %  04/01/23 1625 -- -- -- -- -- 99 %  04/01/23 1622 -- -- -- -- -- 99 %  04/01/23 1621 -- -- -- -- -- 99 %  04/01/23 1620 -- -- -- -- -- 99 %  04/01/23 1615 -- -- -- -- -- 99 %  04/01/23 1610 -- -- -- -- -- 99 %  04/01/23 1606 -- -- -- -- -- 99 %  04/01/23 1605 -- -- -- -- -- 99 %  04/01/23 1600 -- -- -- -- -- 99 %  04/01/23 1555 -- -- -- -- -- 98 %  04/01/23 1552 113/77 -- -- 64 -- 99 %  04/01/23 1545 (!) 141/76 -- -- (!) 53 -- 98 %  04/01/23 1542 (!) 143/70 -- -- (!) 54 -- --  04/01/23 1540 -- -- -- -- -- 98 %  04/01/23 1535 -- -- -- -- -- 98 %  04/01/23 1532 (!) 131/99 -- -- (!) 101 -- 99 %  04/01/23 1525 -- -- -- -- -- 98 %  04/01/23 1520 128/76 -- -- 64 -- 99 %  04/01/23 1516 -- -- -- -- -- 99 %  04/01/23 1515 (!) 142/75 -- -- 60 -- 99 %  04/01/23 1510 -- -- -- -- -- 99 %  04/01/23 1509 -- -- -- -- -- 99 %  04/01/23 1505 -- -- -- -- -- 100 %  04/01/23 1500 -- -- -- -- -- 98 %  04/01/23 1305 (!) 96/54 98.9 F (37.2 C) Oral 69 16 --  04/01/23 1245 128/73 -- -- 70 -- --  04/01/23 1040 (!) 94/47 98.2 F (36.8 C) Oral 74 16 --    Physical Exam:  General: alert and no distress Lochia: appropriate Uterine Fundus: firm DVT Evaluation: No evidence of DVT seen on physical exam.  Recent Labs    03/31/23 2123 04/02/23 0616  WBC 10.8* 17.8*  HGB 13.3 11.8*  HCT 38.5 34.8*  PLT 190 168    No results for input(s): "NA", "K", "CL", "CO2CT", "BUN", "CREATININE",  "GLUCOSE", "BILITOT", "ALT", "AST", "ALKPHOS", "PROT", "ALBUMIN" in the last 72 hours.  No results for input(s): "CALCIUM", "MG", "PHOS" in the last 72 hours.  No results for input(s): "PROTIME", "APTT", "INR" in the last 72 hours.  No results for input(s): "PROTIME", "APTT", "INR", "FIBRINOGEN" in the last 72 hours. Assessment/Plan: Plan for discharge tomorrow  Shekira Drummer Kerner 38 y.o. G2P1001 PPD#1 sp NSVD at 40.1 after prolonged ROM with meconium, doing well meeting PP milestones 1. PPC: cont current PO pain regimen 2. LC support PRN 3. RH POS, Rubella Imm  Continue routine PP inpatient care   LOS: 2 days   Fujiko Picazo A Shanikka Wonders 04/02/2023, 9:06 AM

## 2023-04-02 NOTE — Lactation Note (Signed)
 This note was copied from a baby's chart.  NICU Lactation Consultation Note  Patient Name: Mackenzie Anderson [id number] Date: 04/02/2023 Age:36 hours  Reason for consult: Initial assessment; 1st time breastfeeding; Primapara; NICU baby; Other (Comment); Term (AMA, Raynauld's disease)  SUBJECTIVE Visited with family of 9 41/13 weeks old NICU female; baby "Mackenzie Anderson" got admitted due to respiratory distress. Mackenzie Anderson is a P1 and her plan is to do both, direct breastfeeding along with pumping and bottle feeding. NICU RN Mackenzie Anderson requested assistance for the next feeding at 12 pm, baby currently on ad lib status and getting ready to transition to room in with mom again. Assisted with breast massage and hand expression prior latching, Mackenzie Anderson able to get small droplets of colostrum, praised her for her efforts. This LC took "Mackenzie Anderson" to the L side in cross cradle hold, she did several attempts to latch at the bare breast but kept slipping off. Once NS # 16 was placed; she was able to stay latched for 17 minutes with both NS and NNS patterns; although no colostrum in NS # 16 or swallows were noted; Mackenzie Anderson did long extensions of the jaw and still had the energy to take a bottle after this feeding at the breast. LC warmed up the bottle and showed Mackenzie Anderson how to pace feed and burp baby, she was very appreciative. Reviewed normal newborn behavior, feeding cues, pumping schedule, pumping log, lactogenesis II/III, CDC and anticipatory guidelines.   OBJECTIVE Infant data: Mother's Current Feeding Choice: Breast Milk and Donor Milk  O2 Device: Room Air FiO2 (%): 21 %  Infant feeding assessment IDFTS - Readiness: 1 IDFTS - Quality: 3   Maternal data: G2P1001 Vaginal, Spontaneous Has patient been taught Hand Expression?: Yes Hand Expression Comments: small droplets of colostrum noted Significant Breast History:: (+) breast changes during the pregnancy Current breast feeding challenges:: NICU  admission Does the patient have breastfeeding experience prior to this delivery?: No Pumping frequency: Pump was set up at 4 hours post-partum but pumping has not been initiated yet Flange Size: 18 Hands-free pumping top sizes: Small/Medium (Blue) Risk factor for low/delayed milk supply:: primipara, prematurity, AMA, infant separation  Pump: Personal (Spectra S1 (through insurance); Medela DEBP, Elvie milk collector)  ASSESSMENT Infant: Latch: Grasps breast easily, tongue down, lips flanged, rhythmical sucking. Audible Swallowing: None Type of Nipple: Everted at rest and after stimulation Comfort (Breast/Nipple): Soft / non-tender Hold (Positioning): Assistance needed to correctly position infant at breast and maintain latch. LATCH Score: 7  Feeding Status: Ad lib Feeding method: Breast Nipple Type: Dr. Irving Burton Preemie  Maternal: Breasts are soft, tissue is very compressible  INTERVENTIONS/PLAN Interventions: Interventions: Breast feeding basics reviewed; Assisted with latch; Skin to skin; Breast massage; Hand express; Breast compression; Adjust position; Support pillows; Coconut oil; DEBP; Education; NICU Pumping Log; CDC Guidelines for Breast Pump Cleaning Tools: Pump; Flanges; Nipple Dorris Carnes; Hands-free pumping top Pump Education: Setup, frequency, and cleaning; Milk Storage Nipple shield size: 16  Plan: STS whenever possible Massage and hand express both breasts Pump both breasts on initiation mode for 15 minutes every 3 hours She'll start taking baby "Mackenzie Anderson" to breast on feeding cues, around feeding times using NS # 16 and will call for assistance PRN  No other support person at this time. All questions and concerns answered, family to contact [facility] services PRN.  Consult Status: NICU follow-up NICU Follow-up type: New admission follow up   Mackenzie Anderson 04/02/2023, 12:36 PM

## 2023-04-02 NOTE — Anesthesia Postprocedure Evaluation (Signed)
 Anesthesia Post Note  Patient: Lurene Robley Denman  Procedure(s) Performed: AN AD HOC LABOR EPIDURAL     Patient location during evaluation: OB High Risk Anesthesia Type: Epidural Level of consciousness: awake and alert Pain management: pain level controlled Vital Signs Assessment: post-procedure vital signs reviewed and stable Respiratory status: spontaneous breathing, nonlabored ventilation and respiratory function stable Cardiovascular status: stable Postop Assessment: no headache, no backache, epidural receding, no apparent nausea or vomiting, patient able to bend at knees, able to ambulate and adequate PO intake Anesthetic complications: no   No notable events documented.  Last Vitals:  Vitals:   04/02/23 0459 04/02/23 0814  BP: 94/63 98/69  Pulse: 79 86  Resp: 17 18  Temp: 36.6 C 36.7 C  SpO2: 97% 100%    Last Pain:  Vitals:   04/02/23 0959  TempSrc:   PainSc: 5    Pain Goal: Patients Stated Pain Goal: 5 (03/31/23 2032)                 Laban Emperor

## 2023-04-02 NOTE — Lactation Note (Signed)
 This note was copied from a baby's chart. Lactation Consultation Note  Patient Name: Mackenzie Anderson [id number] Date: 04/02/2023 Age:36 hours Reason for consult: Initial assessment;1st time breastfeeding;Primapara;NICU baby;Other (Comment);Term (AMA, Raynauld's disease)  NICU RN Debbie asked this LC to come assist Mackenzie Anderson for the next feeding at 2:30 pm; baby was very frantic and crying. Tried to do suck training but unable to, "Mackenzie Anderson" would not suck on a gloved finger and kept crying until she briefly fell asleep on the R side in football hold. Placed baby prone on mother's chest and left couplet engaging in STS care. Mackenzie Anderson will be doing a bottle feeding the next time "Mackenzie Anderson" is cueing in case she doesn't latch. NP Eber Jones came to the room to discharge baby from NICU. Continue current plan of care. All questions and concerns answered, family to contact [facility] services PRN.  Lactation Tools Discussed/Used Tools: Pump;Flanges;Nipple Shields;Hands-free pumping top Nipple shield size: 16 Flange Size: 18 Breast pump type: Double-Electric Breast Pump Pump Education: Setup, frequency, and cleaning;Milk Storage Reason for Pumping: NICU Pumping frequency: Pump was set up at 4 hours post-partum but pumping has not been initiated yet  Interventions Interventions: Breast feeding basics reviewed;Assisted with latch;Skin to skin;Breast massage;Hand express;Breast compression;Adjust position;Support pillows;Coconut oil;DEBP;Education;NICU Pumping Log;CDC Guidelines for Breast Pump Cleaning  Discharge Pump: Personal (Spectra S1 (through insurance); Medela DEBP, Elvie milk collector)  Consult Status Consult Status: Follow-up Date: 04/03/23 Follow-up type: In-patient   Imogene Gravelle Venetia Constable 04/02/2023, 2:48 PM

## 2023-04-02 NOTE — Plan of Care (Signed)

## 2023-04-03 MED ORDER — IBUPROFEN 600 MG PO TABS: 600.0000 mg | ORAL_TABLET | Freq: Four times a day (QID) | ORAL | 0 refills | Status: AC

## 2023-04-03 NOTE — Lactation Note (Signed)
 This note was copied from a baby's chart. Lactation Consultation Note  Patient Name: Girl Armilda Vanderlinden [id number] Date: 04/03/2023 Age:36 hours Reason for consult: Follow-up assessment;NICU baby;Primapara;1st time breastfeeding;Maternal discharge;Term (AMA, Raynaud's disease)  Visited with family of 95 67/57 weeks old female; Ms. Frick reported she has taken baby "[name]" to breast but no breastfeeding documented in Flowsheets since last [facility] consult in NICU yesterday. She also voiced she hasn't been pumping "as she should have" and that the # 18 flanges are a better fit for her.   Assisted with latch again and took [name] to the R side in football hold using NS # 16 but she had a hard time latching without doing suck training, still uncoordinated but once suck training is done and she gets into a rhythmical sucking pattern she's able to easily transition to the breast. She was still feeding at the 10 minutes mark when exiting the room. Couplet might be going home later this afternoon. Reviewed discharge education and the importance of consistent latching/pumping for the onset of lactogenesis II and the prevention of engorgement. Referral to [facility] sent for [facility] OP F/U. Encouraged +8 feedings/24 hours using NS # 16 PRN and to pump afterwards or whenever [name] is getting a bottle. Parents will continue supplementing after feedings/attempts at the breast until mother's milk comes in per feeding choice on admission. FOB and MGM present and supportive. All questions and concerns answered, family to contact [facility] services PRN.  Feeding Mother's Current Feeding Choice: Breast Milk and Formula  LATCH Score Latch: Repeated attempts needed to sustain latch, nipple held in mouth throughout feeding, stimulation needed to elicit sucking reflex.  Audible Swallowing: None  Type of Nipple: Everted at rest and after stimulation  Comfort (Breast/Nipple): Soft / non-tender  Hold (Positioning): Assistance needed to  correctly position infant at breast and maintain latch.  LATCH Score: 6  Lactation Tools Discussed/Used Tools: Pump;Nipple Dorris Carnes;Flanges;Hands-free pumping top;Coconut oil Nipple shield size: 16 Flange Size: 18 Breast pump type: Double-Electric Breast Pump Pump Education: Setup, frequency, and cleaning;Milk Storage Reason for Pumping: induction of lactation Pumping frequency: 2 times/24 hours Pumped volume:  (drops)  Interventions Interventions: Breast feeding basics reviewed;Assisted with latch;Breast compression;Adjust position;Support pillows;Coconut oil;DEBP;Education  Discharge Discharge Education: Engorgement and breast care;Warning signs for feeding baby;Outpatient recommendation;Outpatient Epic message sent Pump: Personal;Manual (Spectra S1 (through insurance); Medela DEBP, Elvie milk collector))  Consult Status Consult Status: Complete Date: 04/03/23 Follow-up type: Call as needed   Yigit Norkus Venetia Constable 04/03/2023, 12:32 PM

## 2023-04-03 NOTE — Progress Notes (Signed)
 No c/o; pain controlled, nml lochia Voids w/o difficulty Breastfeeding Does have h/o anxiety - has zoloft at home, was going to start prn after delivery; has therapist  Patient Vitals for the past 24 hrs:  BP Temp Temp src Pulse Resp SpO2  04/03/23 0833 100/67 98 F (36.7 C) -- 65 16 99 %  04/03/23 0614 104/68 97.9 F (36.6 C) Oral 75 17 97 %  04/02/23 1956 107/71 97.8 F (36.6 C) Oral 69 18 99 %  04/02/23 1656 106/74 97.8 F (36.6 C) Oral 74 17 100 %   A&ox3 Nml respirations Abd: soft,nt,nd; fundus firm and below umb LE: no edema,nt bilat     Latest Ref Rng & Units 04/02/2023    6:16 AM 03/31/2023    9:23 PM 05/03/2011   12:38 PM  CBC  WBC 4.0 - 10.5 K/uL 17.8  10.8  5.8   Hemoglobin 12.0 - 15.0 g/dL 78.2  95.6  21.3   Hematocrit 36.0 - 46.0 % 34.8  38.5  38.9   Platelets 150 - 400 K/uL 168  190  334    A/P: ppd2 s/p svd Doing well, d/c home today Rh pos RI Breastfeeding, girl Anxiety - reports feeling of anxiety - encourage pt to go ahead and start zoloft and f/u with therapise

## 2023-04-03 NOTE — Discharge Summary (Signed)
 Postpartum Discharge Summary  Date of Service updated     Patient Name: Mackenzie Anderson DOB: 1985-05-08 MRN: [id number]  Date of admission: 03/31/2023 Delivery date:04/01/2023 Delivering provider: Clance Boll A Date of discharge: 04/03/2023  Admitting diagnosis: Normal labor [O80, Z37.9] Intrauterine pregnancy: [redacted]w[redacted]d     Secondary diagnosis:  Principal Problem:   Postpartum care following vaginal delivery 3/1 Active Problems:   Normal labor   SVD (spontaneous vaginal delivery)   Second degree perineal laceration   Prolonged rupture of membranes  Additional problems: anxiety    Discharge diagnosis: Term Pregnancy Delivered                                              Complications: None  Hospital course: Onset of Labor With Vaginal Delivery      38 y.o. yo G2P1001 at [redacted]w[redacted]d was admitted in Active Labor on 03/31/2023. Labor course was complicated bynone.  Membrane Rupture Time/Date: 6:20 PM,03/31/2023  Delivery Method:Vaginal, Spontaneous Episiotomy: None Lacerations:  2nd degree Patient had a postpartum course complicated by anxiety.  She is ambulating, tolerating a regular diet, passing flatus, and urinating well. Patient is discharged home in stable condition on 04/03/23.  Newborn Data: Birth date:04/01/2023 Birth time:11:09 PM Gender:Female Living status:Living Apgars:8 ,9  Weight:3190 g   Immunizations administered: Immunization History  Administered Date(s) Administered   Tdap 04/02/2023    Physical exam  Vitals:   04/02/23 1656 04/02/23 1956 04/03/23 0614 04/03/23 0833  BP: 106/74 107/71 104/68 100/67  Pulse: 74 69 75 65  Resp: 17 18 17 16   Temp: 97.8 F (36.6 C) 97.8 F (36.6 C) 97.9 F (36.6 C) 98 F (36.7 C)  TempSrc: Oral Oral Oral   SpO2: 100% 99% 97% 99%  Weight:      Height:        Lab Results  Component Value Date   WBC 17.8 (H) 04/02/2023   HGB 11.8 (L) 04/02/2023   HCT 34.8 (L) 04/02/2023   MCV 88.8 04/02/2023   PLT 168  04/02/2023      Latest Ref Rng & Units 05/03/2011   12:38 PM  CMP  Glucose 70 - 99 mg/dL 782   BUN 6 - 23 mg/dL 10   Creatinine 9.56 - 1.10 mg/dL 2.13   Sodium 086 - 578 mEq/L 135   Potassium 3.5 - 5.1 mEq/L 4.5   Chloride 96 - 112 mEq/L 97   CO2 19 - 32 mEq/L 27   Calcium 8.4 - 10.5 mg/dL 9.3    Edinburgh Score:    04/02/2023    4:53 PM  Edinburgh Postnatal Depression Scale Screening Tool  I have been able to laugh and see the funny side of things. --      After visit meds:  Allergies as of 04/03/2023       Reactions   Hydrocodone Nausea Only, Other (See Comments)   Lethargy   Other Other (See Comments)   Mango - Blisters around mouth        Medication List     STOP taking these medications    acetaminophen 325 MG tablet Commonly known as: TYLENOL   amphetamine-dextroamphetamine 15 MG tablet Commonly known as: ADDERALL   clonazePAM 0.5 MG tablet Commonly known as: KLONOPIN   zonisamide 25 MG capsule Commonly known as: ZONEGRAN       TAKE these medications  ALBUTEROL IN Inhale into the lungs.   BIOTIN PO Take by mouth.   ibuprofen 600 MG tablet Commonly known as: ADVIL Take 1 tablet (600 mg total) by mouth every 6 (six) hours.   rizatriptan 10 MG tablet Commonly known as: MAXALT Take 1 tablet at earliest onset for headache.  May repeat in 2 hours if needed. Maximum 2 tablets in 24 hours.         Discharge home in stable condition Infant Feeding: Breast Infant Disposition:rooming in Discharge instruction: per After Visit Summary and Postpartum booklet. Activity: Advance as tolerated. Pelvic rest for 6 weeks.  Diet: routine diet Anticipated Birth Control: Unsure Postpartum Appointment:6 weeks Future Appointments:No future appointments. Follow up Visit:      04/03/2023 Vick Frees, MD

## 2023-04-03 NOTE — Social Work (Addendum)
 CSW received consult for hx of Anxiety. CSW met with MOB to offer support and complete assessment.    CSW met with MOB at bedside and introduced CSW role. CSW observed MOB in bed, FOB in recliner holding the infant, paternal grandmother and grandfather present at bedside. MOB presented calm and engaged with CSW throughout the visit. CSW offered MOB privacy during the assessment. MOB gave CSW permission to share information with her family present. CSW asked MOB how she has been doing since giving birth. MOB reported that she has been doing good but also feeling anxious. CSW asked MOB how she felt during the pregnancy. MOB reported feeling anxious during the pregnancy. CSW inquired about MOB mental health history. MOB reported having a history of anxiety and since the pregnancy her anxiety has "amplified".MOB shared that her triggers is "want everything to be perfect and clean for the baby."  MOB reported during the pregnancy she started seeing a therapist which she felt has been effective and that she would continue the sessions postpartum. She also has access to a prescription for Zoloft 25mg  to treat symptoms when needed.   CSW provided education regarding the baby blues period vs. perinatal mood disorders, discussed treatment and gave resources for mental health.  CSW recommended MOB complete a self-evaluation during the postpartum time period using the New Mom Checklist from Postpartum Progress and encouraged MOB to contact a medical professional if symptoms are noted at any time. CSW assessed MOB for safety. MOB denied SI/HI. MOB reported that she felt comfortable reaching out to her provider if concerns arise. CSW asked MOB about her supports. MOB identified her spouse, parents, sister and in-laws as supports. MOB shared that she and FOB are in the process of selling their home so they will be staying with family and surrounded by support.   CSW asked MOB if she has items to care for the infant. MOB  reported that she has all essential items. MOB reported that she has chosen a Optometrist at [employer]. CSW assessed MOB for additional need. MOB reported none.    CSW identifies no further need for intervention and no barriers to discharge at this time.  Vivi Barrack, MSW, LCSW Clinical Social Worker  [phone] 2024/01/17  1:18 PM

## 2023-04-04 LAB — TYPE AND SCREEN
ABO/RH(D): O POS
Antibody Screen: POSITIVE
Unit division: 0
Unit division: 0

## 2023-04-04 LAB — BPAM RBC
Blood Product Expiration Date: 202503152359
Blood Product Expiration Date: 202503162359
Unit Type and Rh: 5100
Unit Type and Rh: 5100

## 2023-04-07 ENCOUNTER — Inpatient Hospital Stay (HOSPITAL_COMMUNITY): Payer: 59

## 2023-04-07 ENCOUNTER — Inpatient Hospital Stay (HOSPITAL_COMMUNITY): Admission: RE | Admit: 2023-04-07 | Payer: 59 | Source: Home / Self Care | Admitting: Obstetrics and Gynecology

## 2023-04-10 ENCOUNTER — Telehealth (HOSPITAL_COMMUNITY): Payer: Self-pay | Admitting: *Deleted

## 2023-04-10 NOTE — Telephone Encounter (Signed)
 04/10/2023  Name: LILYANN GRAVELLE MRN: [id number] DOB: 1985-04-08  Reason for Call:  Transition of Care Hospital Discharge Call  Contact Status: Patient Contact Status: Message  Language assistant needed:          Follow-Up Questions:    Inocente Salles Postnatal Depression Scale:  In the Past 7 Days:    PHQ2-9 Depression Scale:     Discharge Follow-up:    Post-discharge interventions: NA  Salena Saner, RN 04/10/2023 14:59
